# Patient Record
Sex: Female | Born: 1975 | Race: Black or African American | Hispanic: No | Marital: Single | Smoking: Never smoker
Health system: Southern US, Community
[De-identification: ages and names within clinical notes are randomized; demographics above are authoritative.]

## PROBLEM LIST (undated history)

## (undated) DIAGNOSIS — K37 Unspecified appendicitis: Secondary | ICD-10-CM

## (undated) HISTORY — DX: Unspecified appendicitis: K37

## (undated) HISTORY — PX: TUBAL LIGATION: SHX77

---

## 2017-01-11 ENCOUNTER — Emergency Department (HOSPITAL_COMMUNITY)
Admission: EM | Admit: 2017-01-11 | Discharge: 2017-01-11 | Disposition: A | Discharge status: Home / Self Care | Payer: Self-pay | Attending: Emergency Medicine | Admitting: Emergency Medicine

## 2017-01-11 ENCOUNTER — Encounter (HOSPITAL_COMMUNITY): Payer: Self-pay | Admitting: Emergency Medicine

## 2017-01-11 DIAGNOSIS — L739 Follicular disorder, unspecified: Secondary | ICD-10-CM | POA: Insufficient documentation

## 2017-01-11 MED ORDER — CHLORHEXIDINE GLUCONATE 4 % EX SOLN: 1.0000 [oz_av] | Freq: Every day | CUTANEOUS | 0 refills | Status: DC

## 2017-01-11 NOTE — ED Provider Notes (Signed)
[facility]-EMERGENCY DEPT Provider Note   CSN: [id number] Arrival date & time: 01/11/17  1012     History   Chief Complaint Chief Complaint  Patient presents with  . Rash    HPI Wanda Lara is a 41 y.o. female presented to the ED for evaluation of recurrent pustules to the left axilla and below left axilla x last week. States she has a history of abscesses in the left axilla that have required incision and drainage in the past however states that the pustules that she is getting now are different, smaller and on the surface. She currently has 4 pustules below her left axilla that are mildly tender and itchy. She is afraid that they will develop into an abscess. She states she works in [employer] and is always sweating. Her bra strap also rubs up against that area.  Pustules below left axilla will pop and new ones will come out. Has tried soap/water and peroxide to clean that area. Denies fevers, chills, body aches. Shaving the area frequently. Does not have pustules or boils to right axilla or groin.   HPI  History reviewed. No pertinent past medical history.  There are no active problems to display for this patient.   History reviewed. No pertinent surgical history.  OB History    No data available       Home Medications    Prior to Admission medications   Medication Sig Start Date End Date Taking? Authorizing Provider  Chlorhexidine Gluconate 4 % SOLN Apply 1 oz topically daily. 01/11/17   Liberty HandyGibbons, Nitisha Civello J, PA-C    Family History No family history on file.  Social History Social History  Substance Use Topics  . Smoking status: Not on file  . Smokeless tobacco: Not on file  . Alcohol use Not on file     Allergies   Patient has no allergy information on record.   Review of Systems Review of Systems  Constitutional: Negative for chills, diaphoresis and fever.  Musculoskeletal: Negative for myalgias.  Skin: Positive for color change.       +pustules      Physical Exam Updated Vital Signs BP 130/78 (BP Location: Left Arm)   Pulse 68   Temp 98.6 F (37 C) (Oral)   Resp 13   Ht 5\' 8"  (1.727 m)   Wt 108.9 kg (240 lb)   SpO2 97%   BMI 36.49 kg/m   Physical Exam  Constitutional: She is oriented to person, place, and time. She appears well-developed and well-nourished. No distress.  NAD.  HENT:  Head: Normocephalic and atraumatic.  Right Ear: External ear normal.  Left Ear: External ear normal.  Nose: Nose normal.  Eyes: Conjunctivae and EOM are normal. No scleral icterus.  Neck: Normal range of motion. Neck supple.  Cardiovascular: Normal rate, regular rhythm and normal heart sounds.   No murmur heard. Pulmonary/Chest: Effort normal and breath sounds normal. She has no wheezes.  Musculoskeletal: Normal range of motion. She exhibits no deformity.  Neurological: She is alert and oriented to person, place, and time.  Skin: Skin is warm and dry. Capillary refill takes less than 2 seconds.  Two scars to left axilla c/w previous I&D of abscess 4 pustules to area right below left axilla with white center,non tender without surrounding cellulitis or fluctuance. These pustules are in between body creases.  Psychiatric: She has a normal mood and affect. Her behavior is normal. Judgment and thought content normal.  Nursing note and vitals reviewed.  ED Treatments / Results  Labs (all labs ordered are listed, but only abnormal results are displayed) Labs Reviewed - No data to display  EKG  EKG Interpretation None       Radiology No results found.  Procedures Procedures (including critical care time)  Medications Ordered in ED Medications - No data to display   Initial Impression / Assessment and Plan / ED Course  I have reviewed the triage vital signs and the nursing notes.  Pertinent labs & imaging results that were available during my care of the patient were reviewed by me and considered in my medical decision  making (see chart for details).    41 year old female presents to ED for recurrent folliculitis. She reports multiple pustules that reoccur to area below left axilla. On exam, she has 4 pustules with white center in between body creases and around area where she shaves. No surrounding fluctuance or cellulitis. No fevers or chills. She works at [employer] and is constantly sweating and also shaves that area. She has a lot of risk factors for folliculitis including obesity, sweating, shaving. No indication for incision and drainage today. We'll discharge with chlorhexidine body wash. Advised patient to avoid occlusive dressing and shaving. She verbalized understanding and is agreeable with plan. She is aware of symptoms that would warrant return to the ED.  Final Clinical Impressions(s) / ED Diagnoses   Final diagnoses:  Folliculitis    New Prescriptions Discharge Medication List as of 01/11/2017 12:30 PM    START taking these medications   Details  Chlorhexidine Gluconate 4 % SOLN Apply 1 oz topically daily., Starting Mon 01/11/2017, Print         Liberty Handy, PA-C 01/11/17 1256    Lorre Nick, MD 01/14/17 [phone]

## 2017-01-11 NOTE — ED Triage Notes (Signed)
Per patient, states raised bumps under left arm-not painful-states one open due to bras rubbing it

## 2017-01-11 NOTE — Discharge Instructions (Signed)
You were evaluated in the ED for recurrent pustules to your left axilla and body. This is consistent with folliculitis or an occluded hair follicle that becomes infected. The best way to prevent this is to avoid occlusive dressing or moist clothing. Tried to keep skin dry, clean and aerated. You have been prescribed a antiseptic topical wash that you can use once daily. Avoid picking at skin as this will increase inflammation and created an abscess. Avoid shaving that area. Monitor for signs of worsening infection including increased swelling, redness, pain, fevers.

## 2017-08-31 ENCOUNTER — Encounter (HOSPITAL_COMMUNITY): Payer: Self-pay

## 2017-08-31 ENCOUNTER — Emergency Department (HOSPITAL_COMMUNITY): Payer: No Typology Code available for payment source

## 2017-08-31 ENCOUNTER — Emergency Department (HOSPITAL_COMMUNITY)
Admission: EM | Admit: 2017-08-31 | Discharge: 2017-08-31 | Disposition: A | Discharge status: Home / Self Care | Payer: No Typology Code available for payment source | Attending: Emergency Medicine | Admitting: Emergency Medicine

## 2017-08-31 ENCOUNTER — Other Ambulatory Visit: Payer: Self-pay

## 2017-08-31 DIAGNOSIS — Y92410 Unspecified street and highway as the place of occurrence of the external cause: Secondary | ICD-10-CM | POA: Diagnosis not present

## 2017-08-31 DIAGNOSIS — Y9389 Activity, other specified: Secondary | ICD-10-CM | POA: Diagnosis not present

## 2017-08-31 DIAGNOSIS — M546 Pain in thoracic spine: Secondary | ICD-10-CM | POA: Diagnosis not present

## 2017-08-31 DIAGNOSIS — S8002XA Contusion of left knee, initial encounter: Secondary | ICD-10-CM | POA: Diagnosis not present

## 2017-08-31 DIAGNOSIS — Y998 Other external cause status: Secondary | ICD-10-CM | POA: Insufficient documentation

## 2017-08-31 DIAGNOSIS — S8992XA Unspecified injury of left lower leg, initial encounter: Secondary | ICD-10-CM | POA: Diagnosis present

## 2017-08-31 MED ORDER — METHOCARBAMOL 500 MG PO TABS: 500.0000 mg | ORAL_TABLET | Freq: Three times a day (TID) | ORAL | 0 refills | Status: DC | PRN

## 2017-08-31 NOTE — Discharge Instructions (Signed)
X-ray shows contusion to outside aspect of left knee. This is likely pressing on your peroneal nerve causing local numbness. Take 1000 mg acetaminophen (tylenol) every 6-8 hours or 600 mg ibuprofen (aleve, advil) for associated pain. Rest. Ice. Robaxin for muscle spasm and tightness of your back.   Follow up with neurology if numbness to outside aspect of knee persists.   Return if numbness worsens or spreads, you have difficulty walking
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
## 2018-08-15 ENCOUNTER — Emergency Department (HOSPITAL_COMMUNITY): Payer: Self-pay

## 2018-08-15 ENCOUNTER — Encounter (HOSPITAL_COMMUNITY): Payer: Self-pay | Admitting: *Deleted

## 2018-08-15 ENCOUNTER — Inpatient Hospital Stay (HOSPITAL_COMMUNITY)
Admission: EM | Admit: 2018-08-15 | Discharge: 2018-08-17 | DRG: 440 | Disposition: A | Discharge status: Home / Self Care | Payer: Self-pay | Attending: General Surgery | Admitting: General Surgery

## 2018-08-15 ENCOUNTER — Other Ambulatory Visit: Payer: Self-pay

## 2018-08-15 DIAGNOSIS — Z5320 Procedure and treatment not carried out because of patient's decision for unspecified reasons: Secondary | ICD-10-CM | POA: Diagnosis present

## 2018-08-15 DIAGNOSIS — K3580 Unspecified acute appendicitis: Secondary | ICD-10-CM | POA: Diagnosis present

## 2018-08-15 DIAGNOSIS — K851 Biliary acute pancreatitis without necrosis or infection: Principal | ICD-10-CM | POA: Diagnosis present

## 2018-08-15 DIAGNOSIS — Z6835 Body mass index (BMI) 35.0-35.9, adult: Secondary | ICD-10-CM

## 2018-08-15 DIAGNOSIS — E669 Obesity, unspecified: Secondary | ICD-10-CM | POA: Diagnosis present

## 2018-08-15 LAB — URINALYSIS, ROUTINE W REFLEX MICROSCOPIC
Bilirubin Urine: NEGATIVE
Glucose, UA: NEGATIVE mg/dL
Hgb urine dipstick: NEGATIVE
Ketones, ur: NEGATIVE mg/dL
Leukocytes,Ua: NEGATIVE
Nitrite: POSITIVE — AB
Protein, ur: NEGATIVE mg/dL
Specific Gravity, Urine: 1.019 (ref 1.005–1.030)
WBC, UA: >50 WBC/hpf — ABNORMAL HIGH (ref 0–5)
pH: 5 (ref 5.0–8.0)

## 2018-08-15 LAB — CBC WITH DIFFERENTIAL/PLATELET
Abs Immature Granulocytes: 0.02 10*3/uL (ref 0.00–0.07)
Basophils Absolute: 0 10*3/uL (ref 0.0–0.1)
Basophils Relative: 0 %
Eosinophils Absolute: 0 10*3/uL (ref 0.0–0.5)
Eosinophils Relative: 0 %
HCT: 35.7 % — ABNORMAL LOW (ref 36.0–46.0)
Hemoglobin: 11 g/dL — ABNORMAL LOW (ref 12.0–15.0)
Immature Granulocytes: 0 %
Lymphocytes Relative: 28 %
Lymphs Abs: 1.7 10*3/uL (ref 0.7–4.0)
MCH: 28.6 pg (ref 26.0–34.0)
MCHC: 30.8 g/dL (ref 30.0–36.0)
MCV: 92.7 fL (ref 80.0–100.0)
Monocytes Absolute: 0.6 10*3/uL (ref 0.1–1.0)
Monocytes Relative: 10 %
Neutro Abs: 3.7 10*3/uL (ref 1.7–7.7)
Neutrophils Relative %: 62 %
Platelets: 337 10*3/uL (ref 150–400)
RBC: 3.85 MIL/uL — ABNORMAL LOW (ref 3.87–5.11)
RDW: 13.3 % (ref 11.5–15.5)
WBC: 6 10*3/uL (ref 4.0–10.5)
nRBC: 0 % (ref 0.0–0.2)

## 2018-08-15 LAB — COMPREHENSIVE METABOLIC PANEL
ALT: 33 U/L (ref 0–44)
AST: 31 U/L (ref 15–41)
Albumin: 3 g/dL — ABNORMAL LOW (ref 3.5–5.0)
Alkaline Phosphatase: 87 U/L (ref 38–126)
Anion gap: 7 (ref 5–15)
BUN: 7 mg/dL (ref 6–20)
CO2: 26 mmol/L (ref 22–32)
Calcium: 9.1 mg/dL (ref 8.9–10.3)
Chloride: 104 mmol/L (ref 98–111)
Creatinine, Ser: 0.81 mg/dL (ref 0.44–1.00)
GFR calc Af Amer: >60 mL/min (ref >60)
GFR calc non Af Amer: >60 mL/min (ref >60)
Glucose, Bld: 112 mg/dL — ABNORMAL HIGH (ref 70–99)
Potassium: 4.2 mmol/L (ref 3.5–5.1)
Sodium: 137 mmol/L (ref 135–145)
Total Bilirubin: 0.9 mg/dL (ref 0.3–1.2)
Total Protein: 7.2 g/dL (ref 6.5–8.1)

## 2018-08-15 LAB — I-STAT BETA HCG BLOOD, ED (MC, WL, AP ONLY): I-stat hCG, quantitative: <5 m[IU]/mL (ref <5)

## 2018-08-15 MED ORDER — LACTATED RINGERS IV SOLN
INTRAVENOUS | Status: DC
  Administered 2018-08-15 – 2018-08-17 (×4): via INTRAVENOUS

## 2018-08-15 MED ORDER — ONDANSETRON HCL 4 MG/2ML IJ SOLN: 4.0000 mg | Freq: Four times a day (QID) | INTRAMUSCULAR | Status: DC | PRN

## 2018-08-15 MED ORDER — ONDANSETRON HCL 4 MG/2ML IJ SOLN
4.0000 mg | Freq: Once | INTRAMUSCULAR | Status: AC
  Administered 2018-08-15: 4 mg via INTRAVENOUS
  Filled 2018-08-15: qty 2

## 2018-08-15 MED ORDER — HEPARIN SODIUM (PORCINE) 5000 UNIT/ML IJ SOLN
5000.0000 [IU] | Freq: Three times a day (TID) | INTRAMUSCULAR | Status: DC
  Administered 2018-08-15 – 2018-08-17 (×5): 5000 [IU] via SUBCUTANEOUS
  Filled 2018-08-15 (×5): qty 1

## 2018-08-15 MED ORDER — IBUPROFEN 600 MG PO TABS: 600.0000 mg | ORAL_TABLET | Freq: Four times a day (QID) | ORAL | Status: DC | PRN

## 2018-08-15 MED ORDER — IOHEXOL 300 MG/ML  SOLN
100.0000 mL | Freq: Once | INTRAMUSCULAR | Status: AC | PRN
  Administered 2018-08-15: 100 mL via INTRAVENOUS

## 2018-08-15 MED ORDER — SIMETHICONE 80 MG PO CHEW: 40.0000 mg | CHEWABLE_TABLET | Freq: Four times a day (QID) | ORAL | Status: DC | PRN

## 2018-08-15 MED ORDER — ONDANSETRON 4 MG PO TBDP: 4.0000 mg | ORAL_TABLET | Freq: Four times a day (QID) | ORAL | Status: DC | PRN

## 2018-08-15 MED ORDER — TRAMADOL HCL 50 MG PO TABS: 50.0000 mg | ORAL_TABLET | Freq: Four times a day (QID) | ORAL | Status: DC | PRN

## 2018-08-15 MED ORDER — HYDROMORPHONE HCL 1 MG/ML IJ SOLN: 0.5000 mg | INTRAMUSCULAR | Status: DC | PRN

## 2018-08-15 MED ORDER — PIPERACILLIN-TAZOBACTAM 3.375 G IVPB
3.3750 g | Freq: Three times a day (TID) | INTRAVENOUS | Status: DC
  Administered 2018-08-15 – 2018-08-17 (×5): 3.375 g via INTRAVENOUS
  Filled 2018-08-15 (×5): qty 50

## 2018-08-15 MED ORDER — ACETAMINOPHEN 325 MG PO TABS
650.0000 mg | ORAL_TABLET | Freq: Four times a day (QID) | ORAL | Status: DC
  Administered 2018-08-15 – 2018-08-16 (×4): 650 mg via ORAL
  Filled 2018-08-15 (×6): qty 2

## 2018-08-15 MED ORDER — HYDRALAZINE HCL 20 MG/ML IJ SOLN: 10.0000 mg | INTRAMUSCULAR | Status: DC | PRN

## 2018-08-15 MED ORDER — SODIUM CHLORIDE 0.9 % IV BOLUS
500.0000 mL | Freq: Once | INTRAVENOUS | Status: AC
  Administered 2018-08-15: 500 mL via INTRAVENOUS

## 2018-08-15 MED ORDER — DIPHENHYDRAMINE HCL 50 MG/ML IJ SOLN: 12.5000 mg | Freq: Four times a day (QID) | INTRAMUSCULAR | Status: DC | PRN

## 2018-08-15 MED ORDER — MORPHINE SULFATE (PF) 4 MG/ML IV SOLN
4.0000 mg | Freq: Once | INTRAVENOUS | Status: AC
  Administered 2018-08-15: 4 mg via INTRAVENOUS
  Filled 2018-08-15: qty 1

## 2018-08-15 MED ORDER — DIPHENHYDRAMINE HCL 12.5 MG/5ML PO ELIX: 12.5000 mg | ORAL_SOLUTION | Freq: Four times a day (QID) | ORAL | Status: DC | PRN

## 2018-08-15 MED ORDER — PIPERACILLIN-TAZOBACTAM 3.375 G IVPB 30 MIN
3.3750 g | Freq: Once | INTRAVENOUS | Status: AC
  Administered 2018-08-15: 3.375 g via INTRAVENOUS
  Filled 2018-08-15: qty 50

## 2018-08-15 NOTE — ED Notes (Signed)
Patient transported to CT 

## 2018-08-15 NOTE — H&P (Signed)
CC: Consult for possible acute appendicitis by Dr. Jacqulyn Bath  HPI: Wanda Lara is an 43 y.o. female with no known medical hx presented to ED with a 2d hx of RLQ abdominal pain. Describes as dull/achy and persistent. Does not radiate. Nothing seems to make it better/worse. She reports never having had anything this severe before. She denies any fever/chills/nausea/vomiting; some diarrhea.   She reports having had a tubal ligation in the past via umbilical incision.   History reviewed. No pertinent past medical history.  History reviewed. No pertinent surgical history.  History reviewed. No pertinent family history.  Social:  reports that she has never smoked. She has never used smokeless tobacco. She reports that she does not drink alcohol or use drugs.  Allergies: No Known Allergies  Medications: I have reviewed the patient's current medications.  Results for orders placed or performed during the hospital encounter of 08/15/18 (from the past 48 hour(s))  Comprehensive metabolic panel     Status: Abnormal   Collection Time: 08/15/18  1:45 PM  Result Value Ref Range   Sodium 137 135 - 145 mmol/L   Potassium 4.2 3.5 - 5.1 mmol/L   Chloride 104 98 - 111 mmol/L   CO2 26 22 - 32 mmol/L   Glucose, Bld 112 (H) 70 - 99 mg/dL   BUN 7 6 - 20 mg/dL   Creatinine, Ser 6.80 0.44 - 1.00 mg/dL   Calcium 9.1 8.9 - 88.1 mg/dL   Total Protein 7.2 6.5 - 8.1 g/dL   Albumin 3.0 (L) 3.5 - 5.0 g/dL   AST 31 15 - 41 U/L   ALT 33 0 - 44 U/L   Alkaline Phosphatase 87 38 - 126 U/L   Total Bilirubin 0.9 0.3 - 1.2 mg/dL   GFR calc non Af Amer >60 >60 mL/min   GFR calc Af Amer >60 >60 mL/min   Anion gap 7 5 - 15    Comment: Performed at [facility], [address] [place], [place] [address]  CBC with Differential     Status: Abnormal   Collection Time: 08/15/18  1:45 PM  Result Value Ref Range   WBC 6.0 4.0 - 10.5 K/uL   RBC 3.85 (L) 3.87 - 5.11 MIL/uL   Hemoglobin 11.0 (L) 12.0 - 15.0 g/dL     HCT 94.5 (L) 85.9 - 46.0 %   MCV 92.7 80.0 - 100.0 fL   MCH 28.6 26.0 - 34.0 pg   MCHC 30.8 30.0 - 36.0 g/dL   RDW 29.2 44.6 - 28.6 %   Platelets 337 150 - 400 K/uL   nRBC 0.0 0.0 - 0.2 %   Neutrophils Relative % 62 %   Neutro Abs 3.7 1.7 - 7.7 K/uL   Lymphocytes Relative 28 %   Lymphs Abs 1.7 0.7 - 4.0 K/uL   Monocytes Relative 10 %   Monocytes Absolute 0.6 0.1 - 1.0 K/uL   Eosinophils Relative 0 %   Eosinophils Absolute 0.0 0.0 - 0.5 K/uL   Basophils Relative 0 %   Basophils Absolute 0.0 0.0 - 0.1 K/uL   Immature Granulocytes 0 %   Abs Immature Granulocytes 0.02 0.00 - 0.07 K/uL    Comment: Performed at [facility], [address] [place], [place] [address]  I-Stat beta hCG blood, ED     Status: None   Collection Time: 08/15/18  1:50 PM  Result Value Ref Range   I-stat hCG, quantitative <5.0 <5 mIU/mL   Comment 3  Comment:   GEST. AGE      CONC.  (mIU/mL)   <=1 WEEK        5 - 50     2 WEEKS       50 - 500     3 WEEKS       100 - 10,000     4 WEEKS     1,000 - 30,000        FEMALE AND NON-PREGNANT FEMALE:     LESS THAN 5 mIU/mL   Urinalysis, Routine w reflex microscopic     Status: Abnormal   Collection Time: 08/15/18  1:57 PM  Result Value Ref Range   Color, Urine AMBER (A) YELLOW    Comment: BIOCHEMICALS MAY BE AFFECTED BY COLOR   APPearance HAZY (A) CLEAR   Specific Gravity, Urine 1.019 1.005 - 1.030   pH 5.0 5.0 - 8.0   Glucose, UA NEGATIVE NEGATIVE mg/dL   Hgb urine dipstick NEGATIVE NEGATIVE   Bilirubin Urine NEGATIVE NEGATIVE   Ketones, ur NEGATIVE NEGATIVE mg/dL   Protein, ur NEGATIVE NEGATIVE mg/dL   Nitrite POSITIVE (A) NEGATIVE   Leukocytes,Ua NEGATIVE NEGATIVE   RBC / HPF 0-5 0 - 5 RBC/hpf   WBC, UA >50 (H) 0 - 5 WBC/hpf   Bacteria, UA RARE (A) NONE SEEN   Squamous Epithelial / LPF 21-50 0 - 5   Mucus PRESENT     Comment: Performed at [facility], [address] [place], [place] [id number]    Ct Abdomen Pelvis W  Contrast  Result Date: 08/15/2018 CLINICAL DATA:  Initial evaluation for acute abdominal pain, appendicitis suspected. EXAM: CT ABDOMEN AND PELVIS WITH CONTRAST TECHNIQUE: Multidetector CT imaging of the abdomen and pelvis was performed using the standard protocol following bolus administration of intravenous contrast. CONTRAST:  100mL OMNIPAQUE IOHEXOL 300 MG/ML  SOLN COMPARISON:  None available. FINDINGS: Lower chest: Minimal subsegmental atelectatic changes seen within the visualized lung bases. Visualized lungs are otherwise clear. Hepatobiliary: Liver demonstrates a normal contrast enhanced appearance. Focal fat deposition noted adjacent to the falciform ligament. Liver otherwise unremarkable. Gallbladder within normal limits. No biliary dilatation. Pancreas: Pancreas within normal limits. Spleen: Spleen within normal limits. Adrenals/Urinary Tract: Adrenal glands are normal. Kidneys equal in size with symmetric enhancement. No nephrolithiasis, hydronephrosis, or focal enhancing renal mass. No hydroureter. Partially distended bladder within normal limits. Stomach/Bowel: Stomach within normal limits. No evidence for bowel obstruction. Appendix: Location: Appendix extends medially from the tip of the cecum (series 6, image 33). Diameter: Base of the appendix dilated up to 1 cm in diameter. Tip of the appendix more normal in caliber measuring 6 mm in diameter. Appendicolith: Negative. Mucosal hyper-enhancement: Present. Extraluminal gas: Negative. Periappendiceal collection: Prominent periappendiceal inflammatory stranding, primarily about the base of the appendix and cecum (series 3, image 62). No frank periappendiceal collection or abscess. No other acute inflammatory changes seen about the bowels. Vascular/Lymphatic: Normal intravascular enhancement seen throughout the intra-abdominal aorta. Mesenteric vessels patent proximally. No adenopathy. Reproductive: Multiple fibroids noted within the uterus, 1 of which  is partially calcified. Ovaries within normal limits. Other: No free air or fluid. Musculoskeletal: No acute osseous finding. No discrete lytic or blastic osseous lesions. Moderate multilevel facet arthropathy noted within the lumbar spine. IMPRESSION: 1. Findings consistent with acute appendicitis, primarily involving the base of the appendix. No evidence for perforation or other complication. 2. No other acute intra-abdominal or pelvic process. 3. Fibroid uterus. Electronically Signed   By: Rise MuBenjamin  McClintock  M.D.   On: 08/15/2018 16:22    ROS - all of the below systems have been reviewed with the patient and positives are indicated with bold text General: chills, fever or night sweats Eyes: blurry vision or double vision ENT: epistaxis or sore throat Allergy/Immunology: itchy/watery eyes or nasal congestion Hematologic/Lymphatic: bleeding problems, blood clots or swollen lymph nodes Endocrine: temperature intolerance or unexpected weight changes Breast: new or changing breast lumps or nipple discharge Resp: cough, shortness of breath, or wheezing CV: chest pain or dyspnea on exertion GI: as per HPI GU: dysuria, trouble voiding, or hematuria MSK: joint pain or joint stiffness Neuro: TIA or stroke symptoms Derm: pruritus and skin lesion changes Psych: anxiety and depression  PE Blood pressure 107/63, pulse 80, temperature 98.9 F (37.2 C), resp. rate 16, height  (1.727 m), weight 107 kg, last menstrual period 07/23/2018, SpO2 99 %. Constitutional: NAD; conversant; no deformities; surgical mask in place Eyes: Moist conjunctiva; no lid lag; anicteric; PERRL Neck: Trachea midline; no thyromegaly Lungs: Normal respiratory effort; no tactile fremitus CV: RRR; no palpable thrills; no pitting edema GI: Abd obese, soft, mildly ttp in RLQ; +Rovsing's; no rebound; no guarding; no tenderness elsewhere; no palpable hepatosplenomegaly MSK: Normal gait; no clubbing/cyanosis Psychiatric:  Appropriate affect; alert and oriented x3 Lymphatic: No palpable cervical or axillary lymphadenopathy  Results for orders placed or performed during the hospital encounter of 08/15/18 (from the past 48 hour(s))  Comprehensive metabolic panel     Status: Abnormal   Collection Time: 08/15/18  1:45 PM  Result Value Ref Range   Sodium 137 135 - 145 mmol/L   Potassium 4.2 3.5 - 5.1 mmol/L   Chloride 104 98 - 111 mmol/L   CO2 26 22 - 32 mmol/L   Glucose, Bld 112 (H) 70 - 99 mg/dL   BUN 7 6 - 20 mg/dL   Creatinine, Ser 9.56 0.44 - 1.00 mg/dL   Calcium 9.1 8.9 - 21.3 mg/dL   Total Protein 7.2 6.5 - 8.1 g/dL   Albumin 3.0 (L) 3.5 - 5.0 g/dL   AST 31 15 - 41 U/L   ALT 33 0 - 44 U/L   Alkaline Phosphatase 87 38 - 126 U/L   Total Bilirubin 0.9 0.3 - 1.2 mg/dL   GFR calc non Af Amer >60 >60 mL/min   GFR calc Af Amer >60 >60 mL/min   Anion gap 7 5 - 15    Comment: Performed at [facility], [address], [place], [place] [address]  CBC with Differential     Status: Abnormal   Collection Time: 08/15/18  1:45 PM  Result Value Ref Range   WBC 6.0 4.0 - 10.5 K/uL   RBC 3.85 (L) 3.87 - 5.11 MIL/uL   Hemoglobin 11.0 (L) 12.0 - 15.0 g/dL   HCT 84.6 (L) 96.2 - 95.2 %   MCV 92.7 80.0 - 100.0 fL   MCH 28.6 26.0 - 34.0 pg   MCHC 30.8 30.0 - 36.0 g/dL   RDW 84.1 32.4 - 40.1 %   Platelets 337 150 - 400 K/uL   nRBC 0.0 0.0 - 0.2 %   Neutrophils Relative % 62 %   Neutro Abs 3.7 1.7 - 7.7 K/uL   Lymphocytes Relative 28 %   Lymphs Abs 1.7 0.7 - 4.0 K/uL   Monocytes Relative 10 %   Monocytes Absolute 0.6 0.1 - 1.0 K/uL   Eosinophils Relative 0 %   Eosinophils Absolute 0.0 0.0 - 0.5 K/uL  Basophils Relative 0 %   Basophils Absolute 0.0 0.0 - 0.1 K/uL   Immature Granulocytes 0 %   Abs Immature Granulocytes 0.02 0.00 - 0.07 K/uL    Comment: Performed at [facility], [address] [place], [place] [address]  I-Stat beta hCG blood, ED     Status: None   Collection Time: 08/15/18   1:50 PM  Result Value Ref Range   I-stat hCG, quantitative <5.0 <5 mIU/mL   Comment 3            Comment:   GEST. AGE      CONC.  (mIU/mL)   <=1 WEEK        5 - 50     2 WEEKS       50 - 500     3 WEEKS       100 - 10,000     4 WEEKS     1,000 - 30,000        FEMALE AND NON-PREGNANT FEMALE:     LESS THAN 5 mIU/mL   Urinalysis, Routine w reflex microscopic     Status: Abnormal   Collection Time: 08/15/18  1:57 PM  Result Value Ref Range   Color, Urine AMBER (A) YELLOW    Comment: BIOCHEMICALS MAY BE AFFECTED BY COLOR   APPearance HAZY (A) CLEAR   Specific Gravity, Urine 1.019 1.005 - 1.030   pH 5.0 5.0 - 8.0   Glucose, UA NEGATIVE NEGATIVE mg/dL   Hgb urine dipstick NEGATIVE NEGATIVE   Bilirubin Urine NEGATIVE NEGATIVE   Ketones, ur NEGATIVE NEGATIVE mg/dL   Protein, ur NEGATIVE NEGATIVE mg/dL   Nitrite POSITIVE (A) NEGATIVE   Leukocytes,Ua NEGATIVE NEGATIVE   RBC / HPF 0-5 0 - 5 RBC/hpf   WBC, UA >50 (H) 0 - 5 WBC/hpf   Bacteria, UA RARE (A) NONE SEEN   Squamous Epithelial / LPF 21-50 0 - 5   Mucus PRESENT     Comment: Performed at [facility], [address] [place], [place] [address]    Ct Abdomen Pelvis W Contrast  Result Date: 08/15/2018 CLINICAL DATA:  Initial evaluation for acute abdominal pain, appendicitis suspected. EXAM: CT ABDOMEN AND PELVIS WITH CONTRAST TECHNIQUE: Multidetector CT imaging of the abdomen and pelvis was performed using the standard protocol following bolus administration of intravenous contrast. CONTRAST:  OMNIPAQUE IOHEXOL 300 MG/ML  SOLN COMPARISON:  None available. FINDINGS: Lower chest: Minimal subsegmental atelectatic changes seen within the visualized lung bases. Visualized lungs are otherwise clear. Hepatobiliary: Liver demonstrates a normal contrast enhanced appearance. Focal fat deposition noted adjacent to the falciform ligament. Liver otherwise unremarkable. Gallbladder within normal limits. No biliary dilatation. Pancreas:  Pancreas within normal limits. Spleen: Spleen within normal limits. Adrenals/Urinary Tract: Adrenal glands are normal. Kidneys equal in size with symmetric enhancement. No nephrolithiasis, hydronephrosis, or focal enhancing renal mass. No hydroureter. Partially distended bladder within normal limits. Stomach/Bowel: Stomach within normal limits. No evidence for bowel obstruction. Appendix: Location: Appendix extends medially from the tip of the cecum (series 6, image 33). Diameter: Base of the appendix dilated up to 1 cm in diameter. Tip of the appendix more normal in caliber measuring 6 mm in diameter. Appendicolith: Negative. Mucosal hyper-enhancement: Present. Extraluminal gas: Negative. Periappendiceal collection: Prominent periappendiceal inflammatory stranding, primarily about the base of the appendix and cecum (series 3, image 62). No frank periappendiceal collection or abscess. No other acute inflammatory changes seen about the bowels. Vascular/Lymphatic: Normal intravascular enhancement seen  throughout the intra-abdominal aorta. Mesenteric vessels patent proximally. No adenopathy. Reproductive: Multiple fibroids noted within the uterus, 1 of which is partially calcified. Ovaries within normal limits. Other: No free air or fluid. Musculoskeletal: No acute osseous finding. No discrete lytic or blastic osseous lesions. Moderate multilevel facet arthropathy noted within the lumbar spine. IMPRESSION: 1. Findings consistent with acute appendicitis, primarily involving the base of the appendix. No evidence for perforation or other complication. 2. No other acute intra-abdominal or pelvic process. 3. Fibroid uterus. Electronically Signed   By: Rise Mu M.D.   On: 08/15/2018 16:22   A/P: Danee Soller is an 43 y.o. female with most likely acute appendicitis involving base - some pericecal inflammation as well which is most likely 2/2 proximity  -The anatomy and physiology of the GI tract was  discussed at length with the patient. The pathophysiology of appendicitis was discussed at length as well with her. -I discussed options moving forward for treatment including observation with IV abx vs surgery. We discussed that with antibiotics alone, there is reasonable success in managing appendicitis. We discussed risks of recurrence at 72yrs being as high as 40% in some studies (APPAC trial in cases of acute uncomplicated appendicitis - exclusions being perforation, abscess, suspected malignancy, appenddicolith, age <18 or >60, peritonitis, serious comorbidities). We discussed surgical procedure including laparoscopic and potential open techniques. We discussed the material risks (including, but not limited to, pain, bleeding, infection, scarring, need for blood transfusion, damage to surrounding structures- blood vessels/nerves/viscus/organs, damage to ureter/bladder, urine leak, leak from staple line, need for additional procedures, need for stoma, hernia, recurrence although quite low, pneumonia, heart attack, stroke, death) benefits and alternatives to surgery were discussed at length. The patient's questions were answered to her satisfaction, she voiced understanding and after considering all options has opted to pursue trial of antibiotic management. Additionally, we discussed typical postoperative expectations and the recovery process. -I inquired re:her concerns regarding surgery and she said she would like to avoid any invasive procedures unless noninvasive treatment options have been exhausted -We will therefore plan to admit to ward for monitoring and IV abx; we discussed potential need for surgery if her condition did not improve on antibiotics as well  Stephanie Coup. Cliffton Asters, M.D. [facility]

## 2018-08-15 NOTE — ED Triage Notes (Signed)
PT reports ABD pain  For 2 Days. Pt reports some diarrhea .

## 2018-08-15 NOTE — ED Provider Notes (Signed)
Emergency Department Provider Note   I have reviewed the triage vital signs and the nursing notes.   HISTORY  Chief Complaint Abdominal Pain   HPI Wanda Lara is a 43 y.o. female presents to the emergency department with right lower quadrant abdominal pain.  Symptoms have been ongoing for the past 2 days.  She denies prior surgical history.  She states that the pain is intermittently sharp and severe but she has a constant, underlying dull pain.  She states that yesterday the pain was mostly in the middle of her abdomen and is now mostly in the right lower quadrant.  Denies any UTI symptoms.  No hematuria.  Denies any vaginal bleeding or discharge.  No concern for STD exposure.  Denies any back or flank pain.  She has had some mild diarrhea but no nausea or vomiting.  No chest discomfort.  Pain is worse with movement or pressing in the area.  History reviewed. No pertinent past medical history.  There are no active problems to display for this patient.   History reviewed. No pertinent surgical history.  Allergies Patient has no known allergies.  History reviewed. No pertinent family history.  Social History Social History   Tobacco Use   Smoking status: Never Smoker   Smokeless tobacco: Never Used  Substance Use Topics   Alcohol use: Never    Frequency: Never   Drug use: Never    Review of Systems  Constitutional: No fever/chills Eyes: No visual changes. ENT: No sore throat. Cardiovascular: Denies chest pain. Respiratory: Denies shortness of breath. Gastrointestinal: Positive RLQ abdominal pain.  No nausea, no vomiting. Mild diarrhea.  No constipation. Genitourinary: Negative for dysuria. Musculoskeletal: Negative for back pain. Skin: Negative for rash. Neurological: Negative for headaches, focal weakness or numbness.  10-point ROS otherwise negative.  ____________________________________________   PHYSICAL EXAM:  VITAL SIGNS: ED Triage Vitals    Enc Vitals Group     BP 08/15/18 1256 133/76     Pulse Rate 08/15/18 1256 80     Resp 08/15/18 1256 20     Temp 08/15/18 1256 98.2 F (36.8 C)     Temp Source 08/15/18 1256 Oral     SpO2 08/15/18 1256 100 %     Weight 08/15/18 1321 236 lb (107 kg)     Height 08/15/18 1321  (1.727 m)     Pain Score 08/15/18 1320 9   Constitutional: Alert and oriented. Well appearing and in no acute distress. Eyes: Conjunctivae are normal. Head: Atraumatic. Nose: No congestion/rhinnorhea. Mouth/Throat: Mucous membranes are moist. Neck: No stridor. Cardiovascular: Normal rate, regular rhythm. Good peripheral circulation. Grossly normal heart sounds.   Respiratory: Normal respiratory effort.  No retractions. Lungs CTAB. Gastrointestinal: Soft with focal RLQ tenderness and positive Rosving's sign. No distention.  Musculoskeletal: No gross deformities of extremities. Neurologic:  Normal speech and language. No gross focal neurologic deficits are appreciated.  Skin:  Skin is warm, dry and intact. No rash noted.  ____________________________________________   LABS (all labs ordered are listed, but only abnormal results are displayed)  Labs Reviewed  COMPREHENSIVE METABOLIC PANEL - Abnormal; Notable for the following components:      Result Value   Glucose, Bld 112 (*)    Albumin 3.0 (*)    All other components within normal limits  CBC WITH DIFFERENTIAL/PLATELET - Abnormal; Notable for the following components:   RBC 3.85 (*)    Hemoglobin 11.0 (*)    HCT 35.7 (*)    All  other components within normal limits  URINALYSIS, ROUTINE W REFLEX MICROSCOPIC - Abnormal; Notable for the following components:   Color, Urine AMBER (*)    APPearance HAZY (*)    Nitrite POSITIVE (*)    WBC, UA >50 (*)    Bacteria, UA RARE (*)    All other components within normal limits  I-STAT BETA HCG BLOOD, ED ([facility], [facility], [facility] ONLY)   ____________________________________________  RADIOLOGY  Ct Abdomen Pelvis W  Contrast  Result Date: 08/15/2018 CLINICAL DATA:  Initial evaluation for acute abdominal pain, appendicitis suspected. EXAM: CT ABDOMEN AND PELVIS WITH CONTRAST TECHNIQUE: Multidetector CT imaging of the abdomen and pelvis was performed using the standard protocol following bolus administration of intravenous contrast. CONTRAST:  OMNIPAQUE IOHEXOL 300 MG/ML  SOLN COMPARISON:  None available. FINDINGS: Lower chest: Minimal subsegmental atelectatic changes seen within the visualized lung bases. Visualized lungs are otherwise clear. Hepatobiliary: Liver demonstrates a normal contrast enhanced appearance. Focal fat deposition noted adjacent to the falciform ligament. Liver otherwise unremarkable. Gallbladder within normal limits. No biliary dilatation. Pancreas: Pancreas within normal limits. Spleen: Spleen within normal limits. Adrenals/Urinary Tract: Adrenal glands are normal. Kidneys equal in size with symmetric enhancement. No nephrolithiasis, hydronephrosis, or focal enhancing renal mass. No hydroureter. Partially distended bladder within normal limits. Stomach/Bowel: Stomach within normal limits. No evidence for bowel obstruction. Appendix: Location: Appendix extends medially from the tip of the cecum (series 6, image 33). Diameter: Base of the appendix dilated up to 1 cm in diameter. Tip of the appendix more normal in caliber measuring 6 mm in diameter. Appendicolith: Negative. Mucosal hyper-enhancement: Present. Extraluminal gas: Negative. Periappendiceal collection: Prominent periappendiceal inflammatory stranding, primarily about the base of the appendix and cecum (series 3, image 62). No frank periappendiceal collection or abscess. No other acute inflammatory changes seen about the bowels. Vascular/Lymphatic: Normal intravascular enhancement seen throughout the intra-abdominal aorta. Mesenteric vessels patent proximally. No adenopathy. Reproductive: Multiple fibroids noted within the uterus, 1 of which  is partially calcified. Ovaries within normal limits. Other: No free air or fluid. Musculoskeletal: No acute osseous finding. No discrete lytic or blastic osseous lesions. Moderate multilevel facet arthropathy noted within the lumbar spine. IMPRESSION: 1. Findings consistent with acute appendicitis, primarily involving the base of the appendix. No evidence for perforation or other complication. 2. No other acute intra-abdominal or pelvic process. 3. Fibroid uterus. Electronically Signed   By: Rise Mu M.D.   On: 08/15/2018 16:22    ____________________________________________   PROCEDURES  Procedure(s) performed:   Procedures  CRITICAL CARE Performed by: Maia Plan Total critical care time: 35 minutes Critical care time was exclusive of separately billable procedures and treating other patients. Critical care was necessary to treat or prevent imminent or life-threatening deterioration. Critical care was time spent personally by me on the following activities: development of treatment plan with patient and/or surrogate as well as nursing, discussions with consultants, evaluation of patient's response to treatment, examination of patient, obtaining history from patient or surrogate, ordering and performing treatments and interventions, ordering and review of laboratory studies, ordering and review of radiographic studies, pulse oximetry and re-evaluation of patient's condition.  Alona Bene, MD Emergency Medicine  ____________________________________________   INITIAL IMPRESSION / ASSESSMENT AND PLAN / ED COURSE  Pertinent labs & imaging results that were available during my care of the patient were reviewed by me and considered in my medical decision making (see chart for details).   Patient presents to the emergency department with right lower quadrant abdominal  pain.  Her exam and history are consistent with acute appendicitis.  Considering pelvic pathology such as  torsion but feel this is less likely given her focal exam.  Plan for labs and CT abdomen pelvis.  Pain and nausea medication provided in the emergency department along with IV fluids.  Viewed.  No leukocytosis.  No other acute findings on lab work or UA.  CT imaging shows acute, uncomplicated appendicitis.  This correlates well with the patient's clinical presentation.  I have contacted general surgery her down to see the patient and started Zosyn. Last PO was noon.   Discussed patient's case with Surgery to request admission. Patient and family (if present) updated with plan. Care transferred to General Surgery service.  I reviewed all nursing notes, vitals, pertinent old records, EKGs, labs, imaging (as available).  ____________________________________________  FINAL CLINICAL IMPRESSION(S) / ED DIAGNOSES  Final diagnoses:  Acute appendicitis, unspecified acute appendicitis type    MEDICATIONS GIVEN DURING THIS VISIT:  Medications  piperacillin-tazobactam (ZOSYN) IVPB 3.375 g (has no administration in time range)  sodium chloride 0.9 % bolus 500 mL (0 mLs Intravenous Stopped 08/15/18 1559)  morphine 4 MG/ML injection 4 mg (4 mg Intravenous Given 08/15/18 1417)  ondansetron (ZOFRAN) injection 4 mg (4 mg Intravenous Given 08/15/18 1417)  iohexol (OMNIPAQUE) 300 MG/ML solution 100 mL (100 mLs Intravenous Contrast Given 08/15/18 1553)    Note:  This document was prepared using Dragon voice recognition software and may include unintentional dictation errors.  Alona BeneJoshua Anajah Sterbenz, MD Emergency Medicine    Alif Petrak, Arlyss RepressJoshua G, MD 08/15/18 [phone]

## 2018-08-16 LAB — BASIC METABOLIC PANEL
Anion gap: 6 (ref 5–15)
BUN: 6 mg/dL (ref 6–20)
CO2: 26 mmol/L (ref 22–32)
Calcium: 8.7 mg/dL — ABNORMAL LOW (ref 8.9–10.3)
Chloride: 104 mmol/L (ref 98–111)
Creatinine, Ser: 0.9 mg/dL (ref 0.44–1.00)
GFR calc Af Amer: >60 mL/min (ref >60)
GFR calc non Af Amer: >60 mL/min (ref >60)
Glucose, Bld: 105 mg/dL — ABNORMAL HIGH (ref 70–99)
Potassium: 3.8 mmol/L (ref 3.5–5.1)
Sodium: 136 mmol/L (ref 135–145)

## 2018-08-16 LAB — CBC WITH DIFFERENTIAL/PLATELET
Abs Immature Granulocytes: 0.01 10*3/uL (ref 0.00–0.07)
Basophils Absolute: 0 10*3/uL (ref 0.0–0.1)
Basophils Relative: 0 %
Eosinophils Absolute: 0 10*3/uL (ref 0.0–0.5)
Eosinophils Relative: 0 %
HCT: 32.8 % — ABNORMAL LOW (ref 36.0–46.0)
Hemoglobin: 10 g/dL — ABNORMAL LOW (ref 12.0–15.0)
Immature Granulocytes: 0 %
Lymphocytes Relative: 36 %
Lymphs Abs: 1.7 10*3/uL (ref 0.7–4.0)
MCH: 28.2 pg (ref 26.0–34.0)
MCHC: 30.5 g/dL (ref 30.0–36.0)
MCV: 92.7 fL (ref 80.0–100.0)
Monocytes Absolute: 0.3 10*3/uL (ref 0.1–1.0)
Monocytes Relative: 7 %
Neutro Abs: 2.7 10*3/uL (ref 1.7–7.7)
Neutrophils Relative %: 57 %
Platelets: 310 10*3/uL (ref 150–400)
RBC: 3.54 MIL/uL — ABNORMAL LOW (ref 3.87–5.11)
RDW: 13.3 % (ref 11.5–15.5)
WBC: 4.8 10*3/uL (ref 4.0–10.5)
nRBC: 0 % (ref 0.0–0.2)

## 2018-08-16 LAB — HIV ANTIBODY (ROUTINE TESTING W REFLEX): HIV Screen 4th Generation wRfx: NONREACTIVE

## 2018-08-16 NOTE — Progress Notes (Signed)
[facility] Progress Note      Assessment/Plan Appendicitis without evidence of rupture or abscess  - pt is refusing surgery and would like to continue medical management at this time - pain is improved  FEN: FLD VTE: SCD's, heparin ID: Zosyn 04/06>> Foley: none Follow up: TBD  DISPO: continue medical management. Advance diet to fulls.     LOS: 1 day    Subjective: CC: appendicitis   Abdominal pain much improved. No issues overnight. No nausea, vomiting, fever, chills, cough or SOB. She is having flatus. She is still refusing surgery.   Objective: Vital signs in last 24 hours: Temp:  [98.2 F (36.8 C)-98.9 F (37.2 C)] 98.4 F (36.9 C) (04/07 0429) Pulse Rate:  [66-85] 67 (04/07 0429) Resp:  [16-20] 16 (04/07 0429) BP: (107-135)/(59-86) 116/59 (04/07 0429) SpO2:  [97 %-100 %] 98 % (04/07 0429) Weight:  [782[107 kg] 107 kg (04/06 1321) Last BM Date: 08/15/18  Intake/Output from previous day: 04/06 0701 - 04/07 0700 In: 1351.9 [I.V.:752.5; IV Piggyback:599.4] Out: -  Intake/Output this shift: No intake/output data recorded.  PE: Gen:  Alert, NAD, pleasant, cooperative Card:  RRR, no M/G/R heard Pulm:  CTA, no W/R/R, rate and effort normal Abd: Soft, obese, ND, +BS, mild suprapubic TTP without guarding, no peritonitis  Skin: no rashes noted, warm and dry   Anti-infectives: Anti-infectives (From admission, onward)   Start     Dose/Rate Route Frequency Ordered Stop   08/15/18 2300  piperacillin-tazobactam (ZOSYN) IVPB 3.375 g     3.375 g 12.5 mL/hr over 240 Minutes Intravenous Every 8 hours 08/15/18 1943     08/15/18 1630  piperacillin-tazobactam (ZOSYN) IVPB 3.375 g     3.375 g 100 mL/hr over 30 Minutes Intravenous  Once 08/15/18 1630 08/15/18 1847      Lab Results:  Recent Labs    08/15/18 1345 08/16/18 0617  WBC 6.0 4.8  HGB 11.0* 10.0*  HCT 35.7* 32.8*  PLT 337 310   BMET Recent Labs    08/15/18 1345 08/16/18 0617  NA  137 136  K 4.2 3.8  CL 104 104  CO2 26 26  GLUCOSE 112* 105*  BUN 7 6  CREATININE 0.81 0.90  CALCIUM 9.1 8.7*   PT/INR No results for input(s): LABPROT, INR in the last 72 hours. CMP     Component Value Date/Time   NA 136 08/16/2018 0617   K 3.8 08/16/2018 0617   CL 104 08/16/2018 0617   CO2 26 08/16/2018 0617   GLUCOSE 105 (H) 08/16/2018 0617   BUN 6 08/16/2018 0617   CREATININE 0.90 08/16/2018 0617   CALCIUM 8.7 (L) 08/16/2018 0617   PROT 7.2 08/15/2018 1345   ALBUMIN 3.0 (L) 08/15/2018 1345   AST 31 08/15/2018 1345   ALT 33 08/15/2018 1345   ALKPHOS 87 08/15/2018 1345   BILITOT 0.9 08/15/2018 1345   GFRNONAA >60 08/16/2018 0617   GFRAA >60 08/16/2018 0617   Lipase  No results found for: LIPASE  Studies/Results: Ct Abdomen Pelvis W Contrast  Result Date: 08/15/2018 CLINICAL DATA:  Initial evaluation for acute abdominal pain, appendicitis suspected. EXAM: CT ABDOMEN AND PELVIS WITH CONTRAST TECHNIQUE: Multidetector CT imaging of the abdomen and pelvis was performed using the standard protocol following bolus administration of intravenous contrast. CONTRAST:  100mL OMNIPAQUE IOHEXOL 300 MG/ML  SOLN COMPARISON:  None available. FINDINGS: Lower chest: Minimal subsegmental atelectatic changes seen within the visualized lung bases. Visualized lungs are otherwise clear. Hepatobiliary: Liver demonstrates a  normal contrast enhanced appearance. Focal fat deposition noted adjacent to the falciform ligament. Liver otherwise unremarkable. Gallbladder within normal limits. No biliary dilatation. Pancreas: Pancreas within normal limits. Spleen: Spleen within normal limits. Adrenals/Urinary Tract: Adrenal glands are normal. Kidneys equal in size with symmetric enhancement. No nephrolithiasis, hydronephrosis, or focal enhancing renal mass. No hydroureter. Partially distended bladder within normal limits. Stomach/Bowel: Stomach within normal limits. No evidence for bowel obstruction. Appendix:  Location: Appendix extends medially from the tip of the cecum (series 6, image 33). Diameter: Base of the appendix dilated up to 1 cm in diameter. Tip of the appendix more normal in caliber measuring 6 mm in diameter. Appendicolith: Negative. Mucosal hyper-enhancement: Present. Extraluminal gas: Negative. Periappendiceal collection: Prominent periappendiceal inflammatory stranding, primarily about the base of the appendix and cecum (series 3, image 62). No frank periappendiceal collection or abscess. No other acute inflammatory changes seen about the bowels. Vascular/Lymphatic: Normal intravascular enhancement seen throughout the intra-abdominal aorta. Mesenteric vessels patent proximally. No adenopathy. Reproductive: Multiple fibroids noted within the uterus, 1 of which is partially calcified. Ovaries within normal limits. Other: No free air or fluid. Musculoskeletal: No acute osseous finding. No discrete lytic or blastic osseous lesions. Moderate multilevel facet arthropathy noted within the lumbar spine. IMPRESSION: 1. Findings consistent with acute appendicitis, primarily involving the base of the appendix. No evidence for perforation or other complication. 2. No other acute intra-abdominal or pelvic process. 3. Fibroid uterus. Electronically Signed   By: Rise Mu M.D.   On: 08/15/2018 16:22      Jerre Simon , [facility] 08/16/2018, 8:25 AM  Pager: [phone] Mon-Wed, Friday 7:00am-4:30pm Thurs 7am-11:30am  Consults: [phone]

## 2018-08-16 NOTE — TOC Progression Note (Addendum)
Transition of Care [facility]) - Progression Note    Patient Details  Name: Wanda Lara MRN: [id number] Date of Birth: 01-01-1976  [facility] CM/SW Contact  Nadene Rubins Adria Devon, RN Phone Number: 08/16/2018, 1:14 PM  Clinical Narrative:     Patient uninsured, entered in [facility]. Patient voices understanding and has $3 co pay      [facility] follow up appointment August 29, 2018 at 1000 am   Expected Discharge Plan: Home/Self Care Barriers to Discharge: Continued Medical Work up  Expected Discharge Plan and Services Expected Discharge Plan: Home/Self Care In-house Referral: Artist Discharge Planning Services: CM Consult, Indigent Health Clinic, MATCH Program, Medication Assistance                     DME Arranged: N/A DME Agency: NA HH Arranged: NA HH Agency: NA   Social Determinants of Health (SDOH) Interventions    Readmission Risk Interventions No flowsheet data found.

## 2018-08-17 MED ORDER — AMOXICILLIN-POT CLAVULANATE 875-125 MG PO TABS: 1.0000 | ORAL_TABLET | Freq: Two times a day (BID) | ORAL | 0 refills | Status: DC

## 2018-08-17 MED ORDER — ACETAMINOPHEN 325 MG PO TABS: 650.0000 mg | ORAL_TABLET | Freq: Four times a day (QID) | ORAL | Status: DC | PRN

## 2018-08-17 MED ORDER — TRAMADOL HCL 50 MG PO TABS: 50.0000 mg | ORAL_TABLET | Freq: Four times a day (QID) | ORAL | 0 refills | Status: DC | PRN

## 2018-08-17 MED ORDER — ACETAMINOPHEN 325 MG PO TABS: 650.0000 mg | ORAL_TABLET | Freq: Four times a day (QID) | ORAL | Status: AC | PRN

## 2018-08-17 MED FILL — traMADol HCL 50 MG TABS: 50 | 3 days supply | Qty: 10 | Fill #0

## 2018-08-17 MED FILL — AMOX-CLAV 875-125 MG TABLET: 875-125 | 7 days supply | Qty: 14 | Fill #0

## 2018-08-17 NOTE — Discharge Instructions (Signed)
Appendicitis, Adult    Appendicitis is inflammation of the appendix. The appendix is a finger-shaped tube that is attached to the large intestine. If appendicitis is not treated, it can cause the appendix to tear (rupture). A ruptured appendix can lead to a life-threatening infection. It can also cause a painful collection of pus (abscess) to form in the appendix.  What are the causes?  This condition may be caused by a blockage in the appendix that leads to infection. The blockage can be caused by:  · A ball of stool (feces).  · Enlarged lymph glands.  In some cases, the cause may not be known.  What increases the risk?  Age is a risk factor. You are more likely to develop this condition if you are between 10 and 30 years of age.  What are the signs or symptoms?  Symptoms of this condition include:  · Pain that starts around the belly button and moves toward the lower right part of the abdomen. The pain can become more severe as time passes. It gets worse with coughing or sudden movements.  · Tenderness in the lower right abdomen.  · Nausea.  · Vomiting.  · Loss of appetite.  · Fever.  · Difficulty passing stool (constipation).  · Passing very loose stools (diarrhea).  · Generally feeling unwell.  How is this diagnosed?  This condition may be diagnosed with:  · A physical exam.  · Blood tests.  · Urine test.  To confirm the diagnosis, an ultrasound, MRI, or CT scan may be done.  How is this treated?  This condition is usually treated with surgery to remove the appendix (appendectomy). There are two methods for doing an appendectomy:  · Open appendectomy. In this surgery, the appendix is removed through a large incision that is made in the lower right abdomen. This procedure may be recommended if:  ? You have major scarring from a previous surgery.  ? You have a bleeding disorder.  ? You are pregnant and are about to give birth.  ? You have a condition that makes it hard to do surgery through small incisions  (laparoscopic procedure). This includes severe infection or a ruptured appendix.  · Laparoscopic appendectomy. In this surgery, the appendix is removed through small incisions. This procedure usually causes less pain and fewer problems than an open appendectomy. It also has a shorter recovery time.  If the appendix has ruptured and an abscess has formed:  · A drain may be placed into the abscess to remove fluid.  · Antibiotic medicines may be given through an IV.  · The appendix may or may not need to be removed.  Follow these instructions at home:  If you had surgery, follow instructions from your health care provider about how to care for yourself at home and how to care for your incision.  Medicines  · Take over-the-counter and prescription medicines only as told by your health care provider.  · If you were prescribed an antibiotic medicine, take it as told by your health care provider. Do not stop taking the antibiotic even if you start to feel better.  Eating and drinking  · Follow instructions from your health care provider about eating restrictions. You may slowly resume a regular diet once your nausea or vomiting stops.  General instructions  · Do not use any products that contain nicotine or tobacco, such as cigarettes, e-cigarettes, and chewing tobacco. If you need help quitting, ask your health care provider.  ·   Do not drive or use heavy machinery while taking prescription pain medicine.  · Ask your health care provider if the medicine prescribed to you can cause constipation. You may need to take steps to prevent or treat constipation, such as:  ? Drink enough fluid to keep your urine pale yellow.  ? Take over-the-counter or prescription medicines.  ? Eat foods that are high in fiber, such as beans, whole grains, and fresh fruits and vegetables.  ? Limit foods that are high in fat and processed sugars, such as fried or sweet foods.  · Keep all follow-up visits as told by your health care provider. This  is important.  Contact a health care provider if:  · There is pus, blood, or excessive drainage coming from your incision.  · You have nausea or vomiting.  Get help right away if you have:  · Worsening abdominal pain.  · A fever.  · Chills.  · Fatigue.  · Muscle aches.  · Shortness of breath.  Summary  · Appendicitis is inflammation of the appendix.  · This condition may be caused by a blockage in the appendix that leads to infection.  · This condition is usually treated with surgery to remove the appendix.  This information is not intended to replace advice given to you by your health care provider. Make sure you discuss any questions you have with your health care provider.  Document Released: 04/27/2005 Document Revised: 10/13/2017 Document Reviewed: 10/13/2017  [employer] Interactive Patient Education © 2019 [employer]

## 2018-08-17 NOTE — Discharge Summary (Signed)
[facility] Discharge Summary   Patient ID: Wanda Lara MRN: [id number] DOB/AGE: 45/10/1975 45 y.o.  Admit date: 08/15/2018 Discharge date: 08/17/2018  Admitting Diagnosis: Appendicitis   Discharge Diagnosis Patient Active Problem List   Diagnosis Date Noted  . Acute appendicitis 08/15/2018    Consultants none  Imaging: Ct Abdomen Pelvis W Contrast  Result Date: 08/15/2018 CLINICAL DATA:  Initial evaluation for acute abdominal pain, appendicitis suspected. EXAM: CT ABDOMEN AND PELVIS WITH CONTRAST TECHNIQUE: Multidetector CT imaging of the abdomen and pelvis was performed using the standard protocol following bolus administration of intravenous contrast. CONTRAST:  100mL OMNIPAQUE IOHEXOL 300 MG/ML  SOLN COMPARISON:  None available. FINDINGS: Lower chest: Minimal subsegmental atelectatic changes seen within the visualized lung bases. Visualized lungs are otherwise clear. Hepatobiliary: Liver demonstrates a normal contrast enhanced appearance. Focal fat deposition noted adjacent to the falciform ligament. Liver otherwise unremarkable. Gallbladder within normal limits. No biliary dilatation. Pancreas: Pancreas within normal limits. Spleen: Spleen within normal limits. Adrenals/Urinary Tract: Adrenal glands are normal. Kidneys equal in size with symmetric enhancement. No nephrolithiasis, hydronephrosis, or focal enhancing renal mass. No hydroureter. Partially distended bladder within normal limits. Stomach/Bowel: Stomach within normal limits. No evidence for bowel obstruction. Appendix: Location: Appendix extends medially from the tip of the cecum (series 6, image 33). Diameter: Base of the appendix dilated up to 1 cm in diameter. Tip of the appendix more normal in caliber measuring 6 mm in diameter. Appendicolith: Negative. Mucosal hyper-enhancement: Present. Extraluminal gas: Negative. Periappendiceal collection: Prominent periappendiceal inflammatory stranding, primarily  about the base of the appendix and cecum (series 3, image 62). No frank periappendiceal collection or abscess. No other acute inflammatory changes seen about the bowels. Vascular/Lymphatic: Normal intravascular enhancement seen throughout the intra-abdominal aorta. Mesenteric vessels patent proximally. No adenopathy. Reproductive: Multiple fibroids noted within the uterus, 1 of which is partially calcified. Ovaries within normal limits. Other: No free air or fluid. Musculoskeletal: No acute osseous finding. No discrete lytic or blastic osseous lesions. Moderate multilevel facet arthropathy noted within the lumbar spine. IMPRESSION: 1. Findings consistent with acute appendicitis, primarily involving the base of the appendix. No evidence for perforation or other complication. 2. No other acute intra-abdominal or pelvic process. 3. Fibroid uterus. Electronically Signed   By: Rise MuBenjamin  McClintock M.D.   On: 08/15/2018 16:22    Procedures none  HPI: Wanda Lara is an 43 y.o. female with no known medical hx presented to ED with a 2d hx of RLQ abdominal pain. Describes as dull/achy and persistent. Does not radiate. Nothing seems to make it better/worse. She reports never having had anything this severe before. She denies any fever/chills/nausea/vomiting; some diarrhea.  She reports having had a tubal ligation in the past via umbilical incision  Hospital Course:  Workup showed acute appendicitis involving base - some pericecal inflammation as well which is most likely 2/2 proximity. Pt refused surgery and wanted to treat appendicitis medically.   Patient was admitted and placed on IV antibiotics and bowel rest. Pain improved and diet was advanced as tolerated.  On 04/08, the patient was voiding well, tolerating diet, ambulating well, not having any pain, vital signs stable, and felt stable for discharge home.  Patient will follow up as outlined below and knows to call with questions or concerns.      Patient was discharged in good condition.  The [place] Substance controlled database was reviewed prior to prescribing narcotic pain medication to this patient.  Physical Exam: Gen:  Alert, NAD, pleasant, cooperative  Card:  RRR, no M/G/R heard Pulm:  CTA, no W/R/R, rate and effort normal Abd: Soft, obese, ND, +BS, no TTP, no peritonitis  Skin: no rashes noted, warm and dry  Allergies as of 08/17/2018   No Known Allergies     Medication List    STOP taking these medications   Chlorhexidine Gluconate 4 % Soln   methocarbamol 500 MG tablet Commonly known as:  ROBAXIN     TAKE these medications   acetaminophen 325 MG tablet Commonly known as:  TYLENOL Take 2 tablets (650 mg total) by mouth every 6 (six) hours as needed for mild pain or fever.   amoxicillin-clavulanate 875-125 MG tablet Commonly known as:  AUGMENTIN Take 1 tablet by mouth every 12 (twelve) hours.   traMADol 50 MG tablet Commonly known as:  ULTRAM Take 1 tablet (50 mg total) by mouth every 6 (six) hours as needed (pain not controlled with ibuprofen).        Follow-up Information    [facility] Follow up.   Why:  August 29, 2018 at 1000 am  Contact information: [address] [place] [address] [phone]       Andria Meuse, MD Follow up on 08/24/2018.   Specialty:  General Surgery Why:  Telehealth Appointment scheduled for 10:30AM Dr. Cliffton Asters will call you during scheduled appointment time.  Contact information: [address] [place] [address] [phone]           Signed: [facility] 08/17/2018, 9:37 AM Pager: [phone] Consults: [phone] Mon-Fri 7:00 am-4:30 pm Sat-Sun 7:00 am-11:30 am

## 2018-08-17 NOTE — Progress Notes (Signed)
AVS given and reviewed with pt. Medications discussed. Rx delivered medications to bedside. All questions answered to satisfaction. Pt verbalized understanding of information given. Pt to be escorted off the unit via wheelchair by staff member.

## 2018-08-17 NOTE — Progress Notes (Signed)
[facility] Progress Note      Assessment/Plan Appendicitis without evidence of rupture or abscess  - pt is refusing surgery and would like to continue medical management at this time - pain is improved  FEN: soft VTE: SCD's, heparin ID: Zosyn 04/06>> Foley: none Follow up: TBD  DISPO: continue medical management. Advance diet soft. One more day of IV antibiotics and likely home tomorrow.    LOS: 2 days    Subjective: CC: appendicitis   No abdominal pain. No issues overnight. No nausea, vomiting, fever, chills, cough, CP, or SOB. She is having BM's but she states no flatus today. No diarrhea. BM's are normal. She wants to go home.    Objective: Vital signs in last 24 hours: Temp:  [97.4 F (36.3 C)-98.4 F (36.9 C)] 98.2 F (36.8 C) (04/08 0507) Pulse Rate:  [63-70] 69 (04/08 0507) Resp:  [16-18] 18 (04/08 0507) BP: (127-142)/(67-82) 127/67 (04/08 0507) SpO2:  [97 %-100 %] 97 % (04/08 0507) Last BM Date: 08/15/18  Intake/Output from previous day: 04/07 0701 - 04/08 0700 In: 360 [P.O.:360] Out: -  Intake/Output this shift: No intake/output data recorded.  PE: Gen:  Alert, NAD, pleasant, cooperative Card:  RRR, no M/G/R heard Pulm:  CTA, no W/R/R, rate and effort normal Abd: Soft, obese, ND, +BS, no TTP, no peritonitis  Skin: no rashes noted, warm and dry   Anti-infectives: Anti-infectives (From admission, onward)   Start     Dose/Rate Route Frequency Ordered Stop   08/15/18 2300  piperacillin-tazobactam (ZOSYN) IVPB 3.375 g     3.375 g 12.5 mL/hr over 240 Minutes Intravenous Every 8 hours 08/15/18 1943     08/15/18 1630  piperacillin-tazobactam (ZOSYN) IVPB 3.375 g     3.375 g 100 mL/hr over 30 Minutes Intravenous  Once 08/15/18 1630 08/15/18 1847      Lab Results:  Recent Labs    08/15/18 1345 08/16/18 0617  WBC 6.0 4.8  HGB 11.0* 10.0*  HCT 35.7* 32.8*  PLT 337 310   BMET Recent Labs    08/15/18 1345 08/16/18 0617   NA 137 136  K 4.2 3.8  CL 104 104  CO2 26 26  GLUCOSE 112* 105*  BUN 7 6  CREATININE 0.81 0.90  CALCIUM 9.1 8.7*   PT/INR No results for input(s): LABPROT, INR in the last 72 hours. CMP     Component Value Date/Time   NA 136 08/16/2018 0617   K 3.8 08/16/2018 0617   CL 104 08/16/2018 0617   CO2 26 08/16/2018 0617   GLUCOSE 105 (H) 08/16/2018 0617   BUN 6 08/16/2018 0617   CREATININE 0.90 08/16/2018 0617   CALCIUM 8.7 (L) 08/16/2018 0617   PROT 7.2 08/15/2018 1345   ALBUMIN 3.0 (L) 08/15/2018 1345   AST 31 08/15/2018 1345   ALT 33 08/15/2018 1345   ALKPHOS 87 08/15/2018 1345   BILITOT 0.9 08/15/2018 1345   GFRNONAA >60 08/16/2018 0617   GFRAA >60 08/16/2018 0617   Lipase  No results found for: LIPASE  Studies/Results: Ct Abdomen Pelvis W Contrast  Result Date: 08/15/2018 CLINICAL DATA:  Initial evaluation for acute abdominal pain, appendicitis suspected. EXAM: CT ABDOMEN AND PELVIS WITH CONTRAST TECHNIQUE: Multidetector CT imaging of the abdomen and pelvis was performed using the standard protocol following bolus administration of intravenous contrast. CONTRAST:  OMNIPAQUE IOHEXOL 300 MG/ML  SOLN COMPARISON:  None available. FINDINGS: Lower chest: Minimal subsegmental atelectatic changes seen within the visualized lung bases. Visualized lungs  are otherwise clear. Hepatobiliary: Liver demonstrates a normal contrast enhanced appearance. Focal fat deposition noted adjacent to the falciform ligament. Liver otherwise unremarkable. Gallbladder within normal limits. No biliary dilatation. Pancreas: Pancreas within normal limits. Spleen: Spleen within normal limits. Adrenals/Urinary Tract: Adrenal glands are normal. Kidneys equal in size with symmetric enhancement. No nephrolithiasis, hydronephrosis, or focal enhancing renal mass. No hydroureter. Partially distended bladder within normal limits. Stomach/Bowel: Stomach within normal limits. No evidence for bowel obstruction.  Appendix: Location: Appendix extends medially from the tip of the cecum (series 6, image 33). Diameter: Base of the appendix dilated up to 1 cm in diameter. Tip of the appendix more normal in caliber measuring 6 mm in diameter. Appendicolith: Negative. Mucosal hyper-enhancement: Present. Extraluminal gas: Negative. Periappendiceal collection: Prominent periappendiceal inflammatory stranding, primarily about the base of the appendix and cecum (series 3, image 62). No frank periappendiceal collection or abscess. No other acute inflammatory changes seen about the bowels. Vascular/Lymphatic: Normal intravascular enhancement seen throughout the intra-abdominal aorta. Mesenteric vessels patent proximally. No adenopathy. Reproductive: Multiple fibroids noted within the uterus, 1 of which is partially calcified. Ovaries within normal limits. Other: No free air or fluid. Musculoskeletal: No acute osseous finding. No discrete lytic or blastic osseous lesions. Moderate multilevel facet arthropathy noted within the lumbar spine. IMPRESSION: 1. Findings consistent with acute appendicitis, primarily involving the base of the appendix. No evidence for perforation or other complication. 2. No other acute intra-abdominal or pelvic process. 3. Fibroid uterus. Electronically Signed   By: Rise MuBenjamin  McClintock M.D.   On: 08/15/2018 16:22      Jerre SimonJessica L Tanieka Pownall , [facility] 08/17/2018, 7:58 AM  Pager: [phone] Mon-Wed, Friday 7:00am-4:30pm Thurs 7am-11:30am  Consults: [phone]

## 2018-08-28 NOTE — Progress Notes (Deleted)
Patient ID: Wanda Lara, female   DOB: 06-17-1975, 43 y.o.   MRN: [id number] Virtual Visit via Video Note  I connected with@ on 08/28/18 at@ by a video enabled telemedicine application and verified that I am speaking with the correct person using two identifiers.   Consent:  I discussed the limitations, risks, security and privacy concerns of performing an evaluation and management service by video visit and the availability of in person appointments. I also discussed with the patient that there may be a patient responsible charge related to this service. The patient expressed understanding and agreed to proceed.  Location of patient:  Location of provider:  Persons participating in the televisit with the patient.       History of Present Illness: DOB/AGE: Jun 09, 1975 43 y.o.  Admit date: 08/15/2018 Discharge date: 08/17/2018  Admitting Diagnosis: Appendicitis   Discharge Diagnosis     Patient Active Problem List   Diagnosis Date Noted  . Acute appendicitis 08/15/2018    Consultants none  Imaging:  ImagingResults(Last48hours)  Ct Abdomen Pelvis W Contrast  Result Date: 08/15/2018 CLINICAL DATA:  Initial evaluation for acute abdominal pain, appendicitis suspected. EXAM: CT ABDOMEN AND PELVIS WITH CONTRAST TECHNIQUE: Multidetector CT imaging of the abdomen and pelvis was performed using the standard protocol following bolus administration of intravenous contrast. CONTRAST:  OMNIPAQUE IOHEXOL 300 MG/ML  SOLN COMPARISON:  None available. FINDINGS: Lower chest: Minimal subsegmental atelectatic changes seen within the visualized lung bases. Visualized lungs are otherwise clear. Hepatobiliary: Liver demonstrates a normal contrast enhanced appearance. Focal fat deposition noted adjacent to the falciform ligament. Liver otherwise unremarkable. Gallbladder within normal limits. No biliary dilatation. Pancreas: Pancreas within normal limits. Spleen: Spleen within normal  limits. Adrenals/Urinary Tract: Adrenal glands are normal. Kidneys equal in size with symmetric enhancement. No nephrolithiasis, hydronephrosis, or focal enhancing renal mass. No hydroureter. Partially distended bladder within normal limits. Stomach/Bowel: Stomach within normal limits. No evidence for bowel obstruction. Appendix: Location: Appendix extends medially from the tip of the cecum (series 6, image 33). Diameter: Base of the appendix dilated up to 1 cm in diameter. Tip of the appendix more normal in caliber measuring 6 mm in diameter. Appendicolith: Negative. Mucosal hyper-enhancement: Present. Extraluminal gas: Negative. Periappendiceal collection: Prominent periappendiceal inflammatory stranding, primarily about the base of the appendix and cecum (series 3, image 62). No frank periappendiceal collection or abscess. No other acute inflammatory changes seen about the bowels. Vascular/Lymphatic: Normal intravascular enhancement seen throughout the intra-abdominal aorta. Mesenteric vessels patent proximally. No adenopathy. Reproductive: Multiple fibroids noted within the uterus, 1 of which is partially calcified. Ovaries within normal limits. Other: No free air or fluid. Musculoskeletal: No acute osseous finding. No discrete lytic or blastic osseous lesions. Moderate multilevel facet arthropathy noted within the lumbar spine. IMPRESSION: 1. Findings consistent with acute appendicitis, primarily involving the base of the appendix. No evidence for perforation or other complication. 2. No other acute intra-abdominal or pelvic process. 3. Fibroid uterus. Electronically Signed   By: Rise Mu M.D.   On: 08/15/2018 16:22     Procedures none  HPI: Wanda Lara an 43 y.o.femalewith no known medical hx presented to ED with a 2d hx of RLQ abdominal pain. Describes as dull/achy and persistent. Does not radiate. Nothing seems to make it better/worse. She reports never having had anything this  severe before. She denies any fever/chills/nausea/vomiting; some diarrhea.  She reports having had a tubal ligation in the past via umbilical incision  Hospital Course:  Workup showed acute appendicitis involving  base - some pericecal inflammation as well which is most likely 2/2 proximity. Pt refused surgery and wanted to treat appendicitis medically.   Patient was admitted and placed on IV antibiotics and bowel rest. Pain improved and diet was advanced as tolerated.  On 04/08, the patient was voiding well, tolerating diet, ambulating well, not having any pain, vital signs stable, and felt stable for discharge home.  Patient will follow up as outlined below and knows to call with questions or concerns.     Patient was discharged in good condition.  The [place] Substance controlled database was reviewed prior to prescribing narcotic pain medication to this patient.    Observations/Objective:   Assessment and Plan:   Follow Up Instructions:    I discussed the assessment and treatment plan with the patient. The patient was provided an opportunity to ask questions and all were answered. The patient agreed with the plan and demonstrated an understanding of the instructions.   The patient was advised to call back or seek an in-person evaluation if the symptoms worsen or if the condition fails to improve as anticipated.  I provided *** minutes of non-face-to-face time during this encounter  including  median intraservice time , review of notes, labs, imaging, medications  and explaining diagnosis and management to the patient .    Shan LevansPatrick , MD

## 2018-08-29 ENCOUNTER — Ambulatory Visit: Payer: Self-pay | Attending: Critical Care Medicine | Admitting: Critical Care Medicine

## 2018-08-29 ENCOUNTER — Other Ambulatory Visit: Payer: Self-pay

## 2018-11-30 ENCOUNTER — Ambulatory Visit (INDEPENDENT_AMBULATORY_CARE_PROVIDER_SITE_OTHER): Payer: Self-pay | Admitting: Physician Assistant

## 2018-11-30 ENCOUNTER — Other Ambulatory Visit (INDEPENDENT_AMBULATORY_CARE_PROVIDER_SITE_OTHER): Payer: Self-pay

## 2018-11-30 ENCOUNTER — Encounter (INDEPENDENT_AMBULATORY_CARE_PROVIDER_SITE_OTHER): Payer: Self-pay

## 2018-11-30 ENCOUNTER — Encounter: Payer: Self-pay | Admitting: Physician Assistant

## 2018-11-30 VITALS — BP 118/80 | HR 99 | Temp 98.3°F | Ht 68.0 in | Wt 251.0 lb

## 2018-11-30 DIAGNOSIS — R194 Change in bowel habit: Secondary | ICD-10-CM

## 2018-11-30 DIAGNOSIS — Z8719 Personal history of other diseases of the digestive system: Secondary | ICD-10-CM

## 2018-11-30 DIAGNOSIS — R195 Other fecal abnormalities: Secondary | ICD-10-CM

## 2018-11-30 LAB — SEDIMENTATION RATE: Sed Rate: 103 mm/hr — ABNORMAL HIGH (ref 0–20)

## 2018-11-30 LAB — CBC WITH DIFFERENTIAL/PLATELET
Basophils Absolute: 0 10*3/uL (ref 0.0–0.1)
Basophils Relative: 0.3 % (ref 0.0–3.0)
Eosinophils Absolute: 0 10*3/uL (ref 0.0–0.7)
Eosinophils Relative: 0.1 % (ref 0.0–5.0)
HCT: 37.3 % (ref 36.0–46.0)
Hemoglobin: 12.1 g/dL (ref 12.0–15.0)
Lymphocytes Relative: 37.7 % (ref 12.0–46.0)
Lymphs Abs: 1.5 10*3/uL (ref 0.7–4.0)
MCHC: 32.5 g/dL (ref 30.0–36.0)
MCV: 90.6 fl (ref 78.0–100.0)
Monocytes Absolute: 0.7 10*3/uL (ref 0.1–1.0)
Monocytes Relative: 17.6 % — ABNORMAL HIGH (ref 3.0–12.0)
Neutro Abs: 1.8 10*3/uL (ref 1.4–7.7)
Neutrophils Relative %: 44.3 % (ref 43.0–77.0)
Platelets: 261 10*3/uL (ref 150.0–400.0)
RBC: 4.12 Mil/uL (ref 3.87–5.11)
RDW: 14.2 % (ref 11.5–15.5)
WBC: 4 10*3/uL (ref 4.0–10.5)

## 2018-11-30 MED ORDER — NA SULFATE-K SULFATE-MG SULF 17.5-3.13-1.6 GM/177ML PO SOLN: ORAL | 0 refills | Status: AC

## 2018-11-30 NOTE — Patient Instructions (Signed)
If you are age 45 or older, your body mass index should be between 23-30. Your Body mass index is 38.16 kg/m. If this is out of the aforementioned range listed, please consider follow up with your Primary Care Provider.  If you are age 26 or younger, your body mass index should be between 19-25. Your Body mass index is 38.16 kg/m. If this is out of the aformentioned range listed, please consider follow up with your Primary Care Provider.   You have been scheduled for a colonoscopy. Please follow written instructions given to you at your visit today.  Please pick up your prep supplies at the pharmacy within the next 1-3 days. If you use inhalers (even only as needed), please bring them with you on the day of your procedure. Your physician has requested that you go to www.startemmi.com and enter the access code given to you at your visit today. This web site gives a general overview about your procedure. However, you should still follow specific instructions given to you by our office regarding your preparation for the procedure.  We have sent the following medications to your pharmacy for you to pick up at your convenience: [place] provider has requested that you go to the basement level for lab work before leaving today. Press "B" on the elevator. The lab is located at the first door on the left as you exit the elevator.  Thank you for choosing me and [facility].   Amy Esterwood, PA-C

## 2018-11-30 NOTE — Progress Notes (Signed)
Subjective:    Patient ID: Wanda Lara, female    DOB: 06/28/1975, 43 y.o.   MRN: [id number]  HPI Wanda Lara is a pleasant 43 year old African-American female, new to GI today and referred by Dr. Thayer Ohmhris [facility] for consideration of colonoscopy. Patient has not had any prior GI evaluation and is otherwise healthy. She was hospitalized in April 2020 with acute lower abdominal pain, and CT scan was consistent with acute appendicitis, without perforation or abscess.  Also noted multiple fibroids.  Per the note patient preferred non-surgical approach , and was treated with IV antibiotics, and discharged home to complete a course of Augmentin. She says her pain resolved and she has not had any recurrence since. Her appetite has been good, no difficulty with nausea or vomiting.  No abdominal discomfort or cramping.  She has noted a change in her bowel habits however since that hospitalization with persistence of much looser stool on a daily basis.  She is generally having 2-3 bowel movements per day, no melena or hematochezia.  She has had an occasional episode of nighttime diarrhea awakening her from sleep.  Otherwise she has been feeling well. Family  history is negative for GI disease as far she is aware. Labs from April 2020 hemoglobin 10.8 hematocrit 32.8 MCV of 92 chemistries unremarkable. She does have history of chronically very heavy menses.  Review of Systems .Pertinent positive and negative review of systems were noted in the above HPI section.  All other review of systems was otherwise negative.  Outpatient Encounter Medications as of 11/30/2018  Medication Sig  . acetaminophen (TYLENOL) 325 MG tablet Take 2 tablets (650 mg total) by mouth every 6 (six) hours as needed for mild pain or fever.  . Na Sulfate-K Sulfate-Mg Sulf 17.5-3.13-1.6 GM/177ML SOLN Suprep-Use as directed  . [DISCONTINUED] amoxicillin-clavulanate (AUGMENTIN) 875-125 MG tablet Take 1 tablet by mouth  every 12 (twelve) hours.  . [DISCONTINUED] traMADol (ULTRAM) 50 MG tablet Take 1 tablet (50 mg total) by mouth every 6 (six) hours as needed (pain not controlled with ibuprofen).   No facility-administered encounter medications on file as of 11/30/2018.    No Known Allergies Patient Active Problem List   Diagnosis Date Noted  . Acute appendicitis 08/15/2018   Social History   Socioeconomic History  . Marital status: Single    Spouse name: Not on file  . Number of children: 3  . Years of education: Not on file  . Highest education level: Not on file  Occupational History  . Occupation: Art therapistgeneral manager  Social Needs  . Financial resource strain: Not on file  . Food insecurity    Worry: Not on file    Inability: Not on file  . Transportation needs    Medical: Not on file    Non-medical: Not on file  Tobacco Use  . Smoking status: Never Smoker  . Smokeless tobacco: Never Used  Substance and Sexual Activity  . Alcohol use: Yes    Frequency: Never    Comment: occasionally - twice a month, liquor  . Drug use: Never  . Sexual activity: Not on file  Lifestyle  . Physical activity    Days per week: Not on file    Minutes per session: Not on file  . Stress: Not on file  Relationships  . Social Musicianconnections    Talks on phone: Not on file    Gets together: Not on file    Attends religious service: Not on file  Active member of club or organization: Not on file    Attends meetings of clubs or organizations: Not on file    Relationship status: Not on file  . Intimate partner violence    Fear of current or ex partner: Not on file    Emotionally abused: Not on file    Physically abused: Not on file    Forced sexual activity: Not on file  Other Topics Concern  . Not on file  Social History Narrative  . Not on file    Wanda Lara's family history includes Cancer in her maternal grandmother; Diabetes in her father; Heart disease in her mother.      Objective:    Vitals:    11/30/18 1436  BP: 118/80  Pulse: 99  Temp: 98.3 F (36.8 C)    Physical Exam; Well-developed well-nourishedAA female, in no acute distress.   [id number], BMI 38.1  HEENT; nontraumatic normocephalic, EOMI, PER R LA, sclera anicteric. Oropharynx; not examined/wearing mask/COVID Neck; supple, no JVD Cardiovascular; regular rate and rhythm with S1-S2, no murmur rub or gallop Pulmonary; Clear bilaterally Abdomen; soft, mild tenderness left lower quadrant, no guarding or rebound, nondistended, no palpable mass or hepatosplenomegaly, bowel sounds are active Rectal :not done; Skin; benign exam, no jaundice rash or appreciable lesions Extremities; no clubbing cyanosis or edema skin warm and dry Neuro/Psych; alert and oriented x4, grossly nonfocal mood and affect appropriate       Assessment & Plan:   #58 43 year old female with hospitalization for acute appendicitis April 2020, managed conservatively with antibiotics with resolution of abdominal pain. Patient has had change in bowel habits since hospitalization with much looser stool which has persisted, usually having 2-3 bowel movements per day, no melena or hematochezia. She has not had any recurrence of abdominal pain, denies any abdominal discomfort or cramping.  Rule out antibiotic associated diarrhea-generally would expect resolution at this point  Rule out underlying IBD i.e. Crohn's with appendiceal involvement  Rule out possible C. difficile even onset post antibiotics  #2 mild normocytic anemia-suspect secondary to menorrhagia  Plan; repeat CBC today , check sed rate Check stool for C. difficile by PCR Patient will be scheduled for colonoscopy with Dr. Loletha Carrow.  Procedure was discussed in detail with patient including indications risks and benefits and she is agreeable to proceed.  We have intentionally scheduled this a few weeks out, so that C. difficile can be ruled out prior to procedure.  Amy S Esterwood PA-C 11/30/2018    Cc: No ref. provider found

## 2018-11-30 NOTE — Progress Notes (Signed)
____________________________________________________________  Attending physician addendum:  Thank you for sending this case to me. I have reviewed the entire note, and the outlined plan seems appropriate.  Henry Danis, MD  ____________________________________________________________  

## 2018-12-06 ENCOUNTER — Other Ambulatory Visit: Payer: Self-pay

## 2018-12-06 DIAGNOSIS — Z8719 Personal history of other diseases of the digestive system: Secondary | ICD-10-CM

## 2018-12-06 DIAGNOSIS — R194 Change in bowel habit: Secondary | ICD-10-CM

## 2018-12-06 DIAGNOSIS — R195 Other fecal abnormalities: Secondary | ICD-10-CM

## 2018-12-07 LAB — CLOSTRIDIUM DIFFICILE TOXIN B, QUALITATIVE, REAL-TIME PCR: Toxigenic C. Difficile by PCR: NOT DETECTED

## 2018-12-14 ENCOUNTER — Telehealth: Payer: Self-pay | Admitting: Gastroenterology

## 2018-12-14 NOTE — Telephone Encounter (Signed)
No answer and mailbox is full, unable to leave message on vmail reguarding Covid-19 screening questions. Covid-19 Screening Questions:  Do you now or have you had a fever in the last 14 days?   Do you have any respiratory symptoms of shortness of breath or cough now or in the last 14 days?   Do you have any family members or close contacts with diagnosed or suspected Covid-19 in the past 14 days?   Have you been tested for Covid-19 and found to be positive?

## 2018-12-15 ENCOUNTER — Encounter: Payer: Self-pay | Admitting: Gastroenterology

## 2018-12-15 ENCOUNTER — Telehealth: Payer: Self-pay | Admitting: Gastroenterology

## 2018-12-15 NOTE — Telephone Encounter (Signed)
Patient did not show for colonoscopy today.  No call to cancel.  Assess no-show charge an do not re-schedule.

## 2018-12-29 NOTE — Telephone Encounter (Signed)
Patient indigent, unable to bill fee.Note placed in chart not to reschedule procedure.

## 2020-12-26 ENCOUNTER — Other Ambulatory Visit: Payer: Self-pay | Admitting: Orthopedic Surgery

## 2020-12-26 DIAGNOSIS — M25561 Pain in right knee: Secondary | ICD-10-CM

## 2020-12-26 DIAGNOSIS — R531 Weakness: Secondary | ICD-10-CM

## 2021-01-12 ENCOUNTER — Ambulatory Visit
Admission: RE | Admit: 2021-01-12 | Discharge: 2021-01-12 | Disposition: A | Discharge status: Home / Self Care | Payer: Commercial Managed Care - PPO | Source: Ambulatory Visit | Attending: Orthopedic Surgery | Admitting: Orthopedic Surgery

## 2021-01-12 DIAGNOSIS — R531 Weakness: Secondary | ICD-10-CM

## 2021-01-12 DIAGNOSIS — M25561 Pain in right knee: Secondary | ICD-10-CM

## 2021-06-26 ENCOUNTER — Encounter (HOSPITAL_COMMUNITY): Payer: Self-pay | Admitting: Physician Assistant

## 2021-06-26 ENCOUNTER — Ambulatory Visit (INDEPENDENT_AMBULATORY_CARE_PROVIDER_SITE_OTHER): Payer: Commercial Managed Care - PPO

## 2021-06-26 ENCOUNTER — Ambulatory Visit (HOSPITAL_COMMUNITY)
Admission: EM | Admit: 2021-06-26 | Discharge: 2021-06-26 | Disposition: A | Discharge status: Home / Self Care | Payer: Commercial Managed Care - PPO | Attending: Physician Assistant | Admitting: Physician Assistant

## 2021-06-26 ENCOUNTER — Other Ambulatory Visit: Payer: Self-pay

## 2021-06-26 DIAGNOSIS — M25512 Pain in left shoulder: Secondary | ICD-10-CM | POA: Diagnosis not present

## 2021-06-26 MED ORDER — NAPROXEN 375 MG PO TABS: 375.0000 mg | ORAL_TABLET | Freq: Two times a day (BID) | ORAL | 0 refills | Status: AC

## 2021-06-26 NOTE — ED Triage Notes (Signed)
Onset left shoulder pain x 2-3 days. No known injury.

## 2021-06-26 NOTE — Discharge Instructions (Signed)
Your x-ray showed some degenerative changes to the rotator cuff.  I think it is important that you follow-up with orthopedics as you may need some advance imaging and further evaluation.  Take Naprosyn twice daily for pain.  You should not take additional NSAIDs including aspirin, ibuprofen/Advil, naproxen/Aleve with this medication.  You can use Tylenol for pain.  Monitor your blood pressure while on NSAIDs and you may have to decrease use if this remains above 140/90.  Use heat and gentle stretch for additional symptom relief.  If you develop any worsening pain, weakness, numbness/paresthesias you need to be seen immediately.  Call to schedule appoint with orthopedics as soon as possible.

## 2021-06-26 NOTE — ED Provider Notes (Signed)
[facility]    CSN: [id number] Arrival date & time: 06/26/21  0815      History   Chief Complaint Chief Complaint  Patient presents with   Shoulder Pain    HPI Wanda Lara is a 46 y.o. female.   Patient presents today with a several day history of left anterior shoulder pain.  She reports she was getting out of bed when she felt a pop and since then she has had ongoing pain with decreased range of motion.  Pain is worse at night, rated 8 on a 0-10 pain scale, described as throbbing, no aggravating relieving factors identified.  She denies previous injury or surgery involving the shoulder.  She denies any numbness, weakness, paresthesias of hand.  She is right-handed.  Is having difficulty daily activities as a result of symptoms.  She has tried ibuprofen 800 with temporary relief of symptoms.  Denies any known injury or increase in activity prior to symptom onset.   Past Medical History:  Diagnosis Date   Appendicitis     Patient Active Problem List   Diagnosis Date Noted   Acute appendicitis 08/15/2018    Past Surgical History:  Procedure Laterality Date   TUBAL LIGATION      OB History   No obstetric history on file.      Home Medications    Prior to Admission medications   Medication Sig Start Date End Date Taking? Authorizing Provider  naproxen (NAPROSYN) 375 MG tablet Take 1 tablet (375 mg total) by mouth 2 (two) times daily. 06/26/21  Yes Kealie Barrie, Derry Skill, PA-C  acetaminophen (TYLENOL) 325 MG tablet Take 2 tablets (650 mg total) by mouth every 6 (six) hours as needed for mild pain or fever. 08/17/18   Focht, Fraser Din, PA  Na Sulfate-K Sulfate-Mg Sulf 17.5-3.13-1.6 GM/177ML SOLN Suprep-Use as directed 11/30/18   Esterwood, Amy S, PA-C    Family History Family History  Problem Relation Age of Onset   Heart disease Mother    Diabetes Father    Cancer Maternal Grandmother        unknown what type    Social History Social History   Tobacco  Use   Smoking status: Never   Smokeless tobacco: Never  Vaping Use   Vaping Use: Every day  Substance Use Topics   Alcohol use: Yes    Comment: occasionally - twice a month, liquor   Drug use: Never     Allergies   Patient has no known allergies.   Review of Systems Review of Systems  Constitutional:  Positive for activity change. Negative for appetite change, fatigue and fever.  Musculoskeletal:  Positive for arthralgias and joint swelling. Negative for myalgias.  Neurological:  Negative for dizziness, weakness, light-headedness, numbness and headaches.    Physical Exam Triage Vital Signs ED Triage Vitals [06/26/21 0855]  Enc Vitals Group     BP (!) 152/87     Pulse Rate 73     Resp 18     Temp 98.3 F (36.8 C)     Temp Source Oral     SpO2 100 %     Weight      Height      Head Circumference      Peak Flow      Pain Score      Pain Loc      Pain Edu?      Excl. in [place]?    No data found.  Updated Vital Signs BP Marland Kitchen)  152/87 (BP Location: Left Arm)    Pulse 73    Temp 98.3 F (36.8 C) (Oral)    Resp 18    LMP 06/26/2021 (Approximate)    SpO2 100%   Visual Acuity Right Eye Distance:   Left Eye Distance:   Bilateral Distance:    Right Eye Near:   Left Eye Near:    Bilateral Near:     Physical Exam Vitals reviewed.  Constitutional:      General: She is awake. She is not in acute distress.    Appearance: Normal appearance. She is well-developed. She is not ill-appearing.     Comments: Very pleasant female appears stated age in no acute distress sitting comfortably in exam room  HENT:     Head: Normocephalic and atraumatic.  Cardiovascular:     Rate and Rhythm: Normal rate and regular rhythm.     Pulses:          Radial pulses are 2+ on the right side and 2+ on the left side.     Heart sounds: Normal heart sounds, S1 normal and S2 normal. No murmur heard. Pulmonary:     Effort: Pulmonary effort is normal.     Breath sounds: Normal breath sounds. No  wheezing, rhonchi or rales.     Comments: Clear to auscultation bilaterally Musculoskeletal:     Left shoulder: Tenderness present. No swelling, deformity or bony tenderness. Decreased range of motion. Normal strength.     Comments: Left shoulder: No swelling or deformity noted.  Decreased range of motion with abduction, internal and external rotation, flexion, overhead flexion.  Patient unable to perform Apley scratch or empty can.  Tenderness palpation over AC joint.  Hands neurovascularly intact.  Psychiatric:        Behavior: Behavior is cooperative.     UC Treatments / Results  Labs (all labs ordered are listed, but only abnormal results are displayed) Labs Reviewed - No data to display  EKG   Radiology DG Shoulder Left  Result Date: 06/26/2021 CLINICAL DATA:  Pain and decreased range of motion. EXAM: LEFT SHOULDER - 2+ VIEW COMPARISON:  None. FINDINGS: Visualized portion of the left hemithorax is normal. No acute fracture or dislocation. Suspect subtle calcific tendinitis of the rotator cuff insertion. IMPRESSION: No acute osseous abnormality. 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 Electronically Signed   By: Abigail Miyamoto M.D.   On: 06/26/2021 09:41    Procedures Procedures (including critical care time)  Medications Ordered in UC Medications - No data to display  Initial Impression / Assessment and Plan / UC Course  I have reviewed the triage vital signs and the nursing notes.  Pertinent labs & imaging results that were available during my care of the patient were reviewed by me and considered in my medical decision making (see chart for details).     X-ray obtained given bony tenderness showed chronic calcific tendinitis of rotator cuff which is likely cause of pain.  Patient was started on Naprosyn with instruction to monitor blood pressure given elevated blood pressure reading in clinic.  She is not to take additional NSAIDs with this medication  but can use Tylenol, heat, rest for additional symptom relief.  Recommended that she follow-up with orthopedics and was given contact information for local provider with instruction to call to schedule an appointment.  She was provided work excuse note.  Discussed alarm symptoms that warrant emergent evaluation.  Strict return precautions given to which she expressed understanding.  Final Clinical Impressions(s) / UC Diagnoses   Final diagnoses:  Acute pain of left shoulder     Discharge Instructions      Your x-ray showed some degenerative changes to the rotator cuff.  I think it is important that you follow-up with orthopedics as you may need some advance imaging and further evaluation.  Take Naprosyn twice daily for pain.  You should not take additional NSAIDs including aspirin, ibuprofen/Advil, naproxen/Aleve with this medication.  You can use Tylenol for pain.  Monitor your blood pressure while on NSAIDs and you may have to decrease use if this remains above 140/90.  Use heat and gentle stretch for additional symptom relief.  If you develop any worsening pain, weakness, numbness/paresthesias you need to be seen immediately.  Call to schedule appoint with orthopedics as soon as possible.     ED Prescriptions     Medication Sig Dispense Auth. Provider   naproxen (NAPROSYN) 375 MG tablet Take 1 tablet (375 mg total) by mouth 2 (two) times daily. 20 tablet Beena Catano, Derry Skill, PA-C      PDMP not reviewed this encounter.   Terrilee Croak, PA-C 06/26/21 1008

## 2022-08-11 IMAGING — DX DG SHOULDER 2+V*L*
4 series · 4 of 4 positions shown · non-contrast
Comparison: None.

CLINICAL DATA: Pain and decreased range of motion.

EXAM:
LEFT SHOULDER - 2+ VIEW

[shoulder ap]
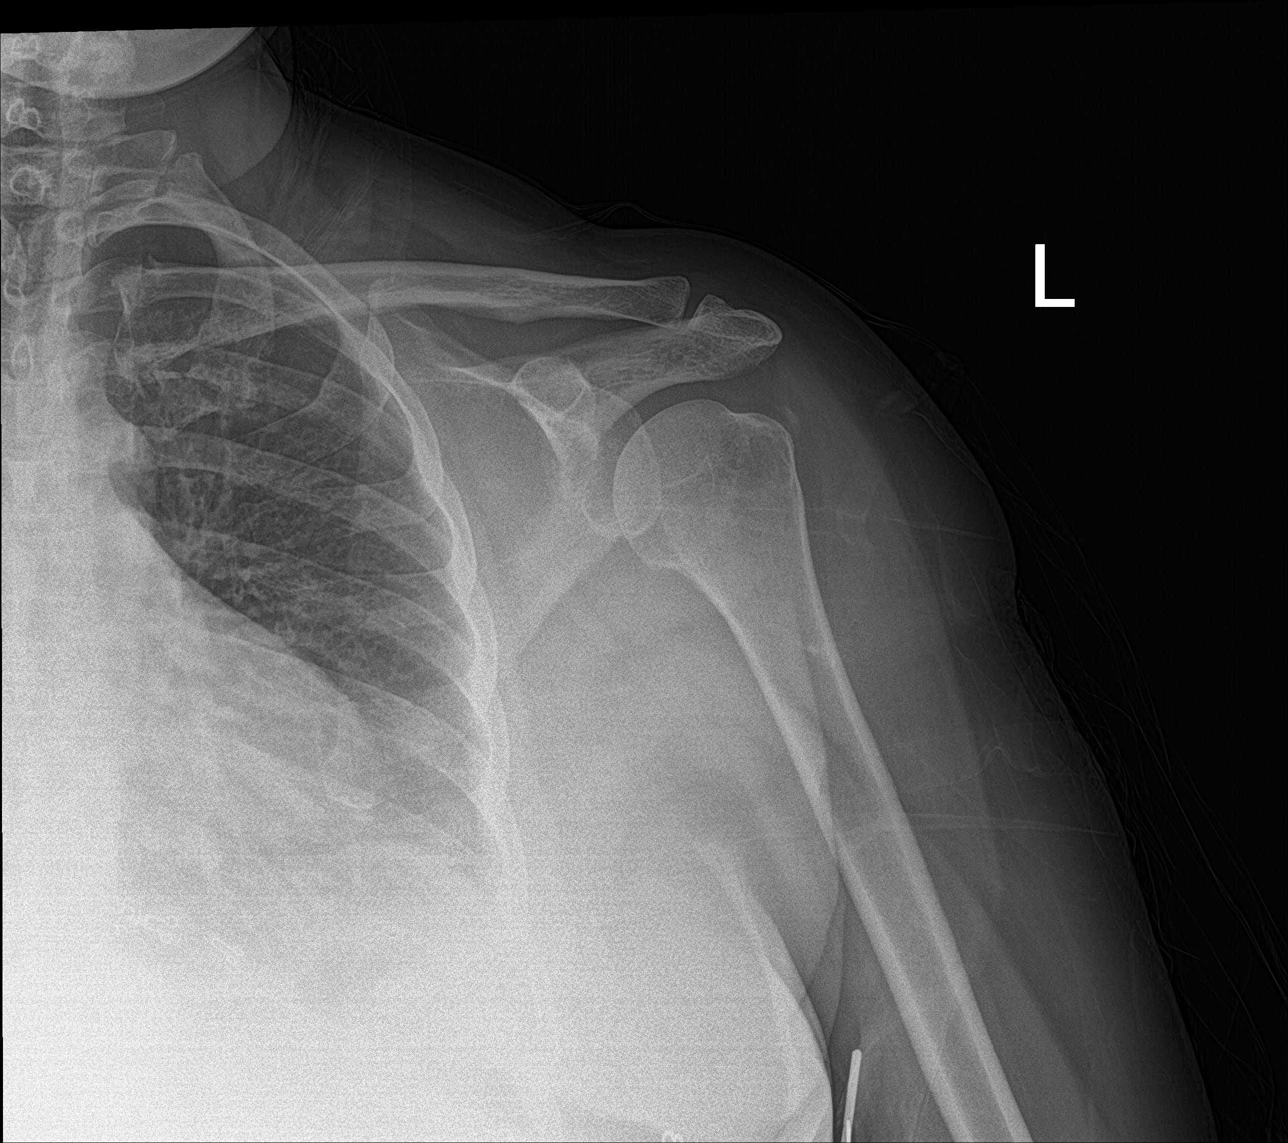

[shoulder grashey]
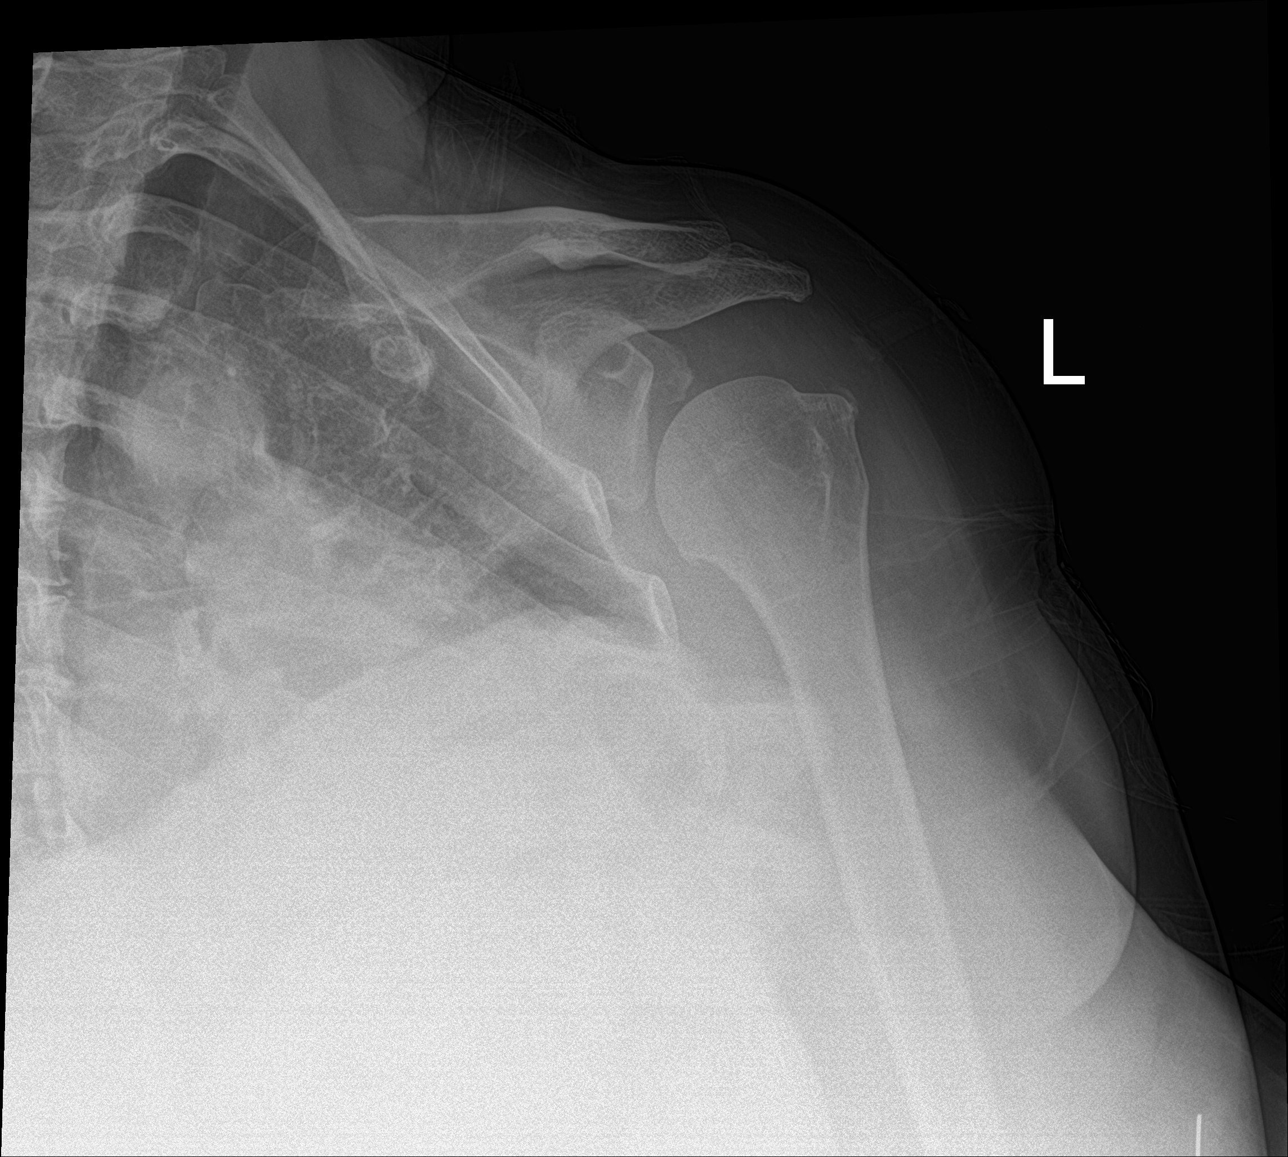

[shoulder y-view]
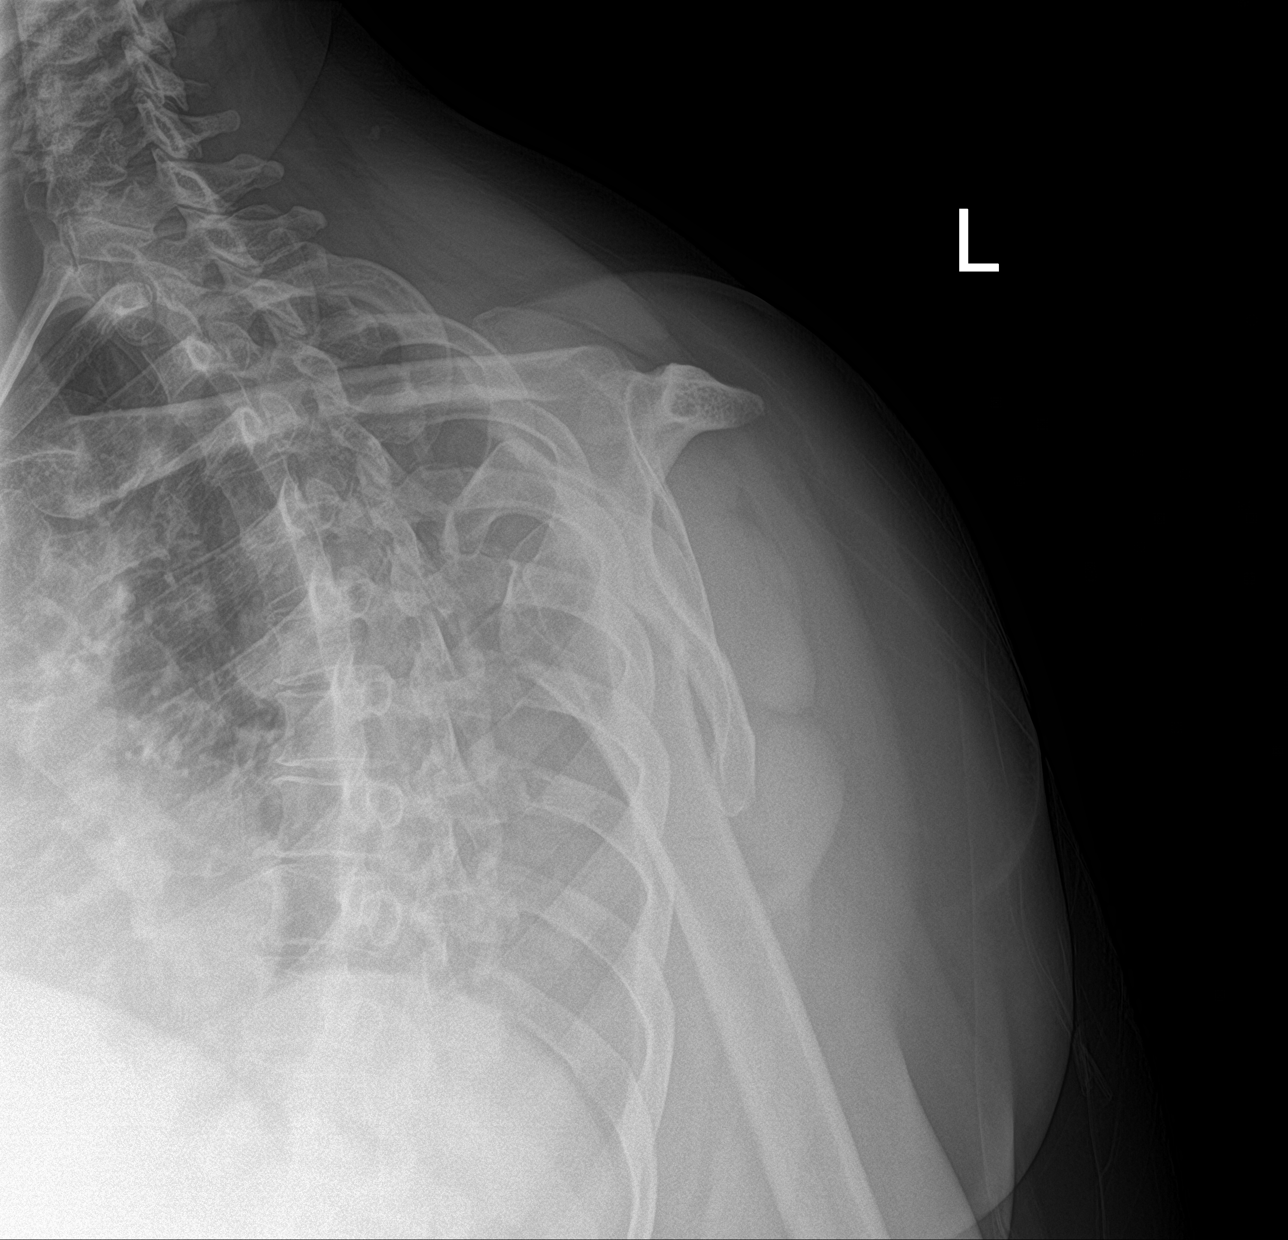

[shoulder axial]
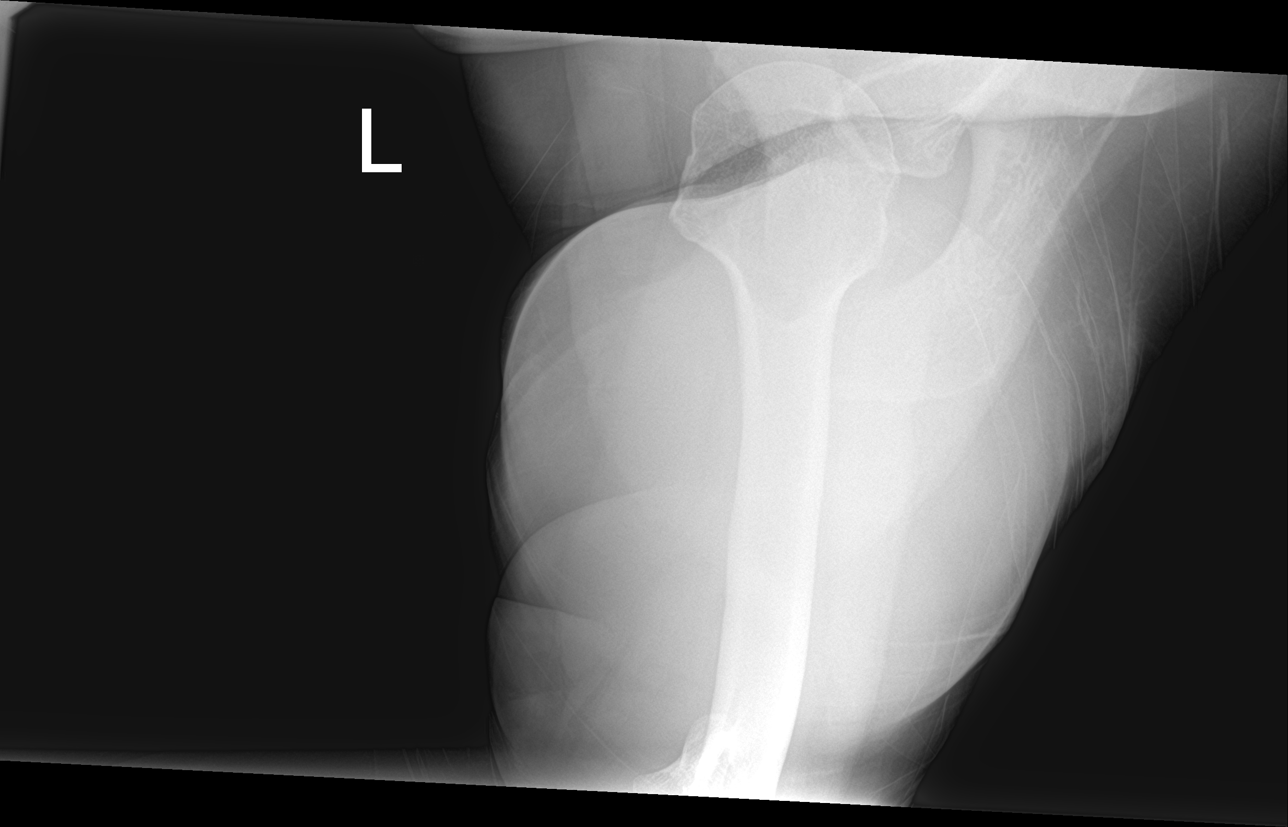

[4 of 4 positions shown; findings below may reference images not displayed]

FINDINGS: Visualized portion of the left hemithorax is normal. No acute
fracture or dislocation. Suspect subtle calcific tendinitis of the
rotator cuff insertion.
IMPRESSION: No acute osseous abnormality. Suspect mild chronic calcific
tendinitis of the rotator cuff.
